# Patient Record
Sex: Female | Born: 1975
Health system: Southern US, Community
[De-identification: ages and names within clinical notes are randomized; demographics above are authoritative.]

## PROBLEM LIST (undated history)

## (undated) DIAGNOSIS — E039 Hypothyroidism, unspecified: Secondary | ICD-10-CM

## (undated) DIAGNOSIS — D649 Anemia, unspecified: Secondary | ICD-10-CM

## (undated) HISTORY — DX: Hypothyroidism, unspecified: E03.9

## (undated) HISTORY — PX: ABLATION: SHX5711

## (undated) HISTORY — PX: WISDOM TOOTH EXTRACTION: SHX21

## (undated) HISTORY — DX: Anemia, unspecified: D64.9

---

## 1998-11-13 ENCOUNTER — Emergency Department (HOSPITAL_COMMUNITY): Admission: EM | Admit: 1998-11-13 | Discharge: 1998-11-13 | Payer: Self-pay | Admitting: Emergency Medicine

## 1998-11-13 ENCOUNTER — Encounter: Payer: Self-pay | Admitting: Emergency Medicine

## 1999-04-21 ENCOUNTER — Emergency Department (HOSPITAL_COMMUNITY): Admission: EM | Admit: 1999-04-21 | Discharge: 1999-04-21 | Payer: Self-pay | Admitting: Emergency Medicine

## 1999-04-21 ENCOUNTER — Encounter: Payer: Self-pay | Admitting: Emergency Medicine

## 1999-11-08 ENCOUNTER — Emergency Department (HOSPITAL_COMMUNITY): Admission: EM | Admit: 1999-11-08 | Discharge: 1999-11-08 | Payer: Self-pay | Admitting: Emergency Medicine

## 2000-10-28 ENCOUNTER — Emergency Department (HOSPITAL_COMMUNITY): Admission: EM | Admit: 2000-10-28 | Discharge: 2000-10-28 | Payer: Self-pay | Admitting: Emergency Medicine

## 2001-04-15 ENCOUNTER — Emergency Department (HOSPITAL_COMMUNITY): Admission: EM | Admit: 2001-04-15 | Discharge: 2001-04-16 | Payer: Self-pay | Admitting: Emergency Medicine

## 2001-04-16 ENCOUNTER — Emergency Department (HOSPITAL_COMMUNITY): Admission: EM | Admit: 2001-04-16 | Discharge: 2001-04-16 | Payer: Self-pay | Admitting: Emergency Medicine

## 2001-06-16 ENCOUNTER — Emergency Department (HOSPITAL_COMMUNITY): Admission: EM | Admit: 2001-06-16 | Discharge: 2001-06-16 | Payer: Self-pay | Admitting: Emergency Medicine

## 2002-02-14 ENCOUNTER — Observation Stay (HOSPITAL_COMMUNITY): Admission: EM | Admit: 2002-02-14 | Discharge: 2002-02-15 | Payer: Self-pay

## 2002-02-15 ENCOUNTER — Encounter: Payer: Self-pay | Admitting: Internal Medicine

## 2005-11-14 ENCOUNTER — Other Ambulatory Visit: Admission: RE | Admit: 2005-11-14 | Discharge: 2005-11-14 | Payer: Self-pay | Admitting: Gynecology

## 2006-05-17 ENCOUNTER — Inpatient Hospital Stay (HOSPITAL_COMMUNITY): Admission: AD | Admit: 2006-05-17 | Discharge: 2006-05-20 | Payer: Self-pay | Admitting: Gynecology

## 2006-07-06 ENCOUNTER — Other Ambulatory Visit: Admission: RE | Admit: 2006-07-06 | Discharge: 2006-07-06 | Payer: Self-pay | Admitting: Gynecology

## 2008-11-15 ENCOUNTER — Emergency Department (HOSPITAL_COMMUNITY): Admission: EM | Admit: 2008-11-15 | Discharge: 2008-11-15 | Payer: Self-pay | Admitting: Emergency Medicine

## 2010-12-19 LAB — POCT I-STAT, CHEM 8
BUN: 6 mg/dL (ref 6–23)
Creatinine, Ser: 0.9 mg/dL (ref 0.4–1.2)
Glucose, Bld: 114 mg/dL — ABNORMAL HIGH (ref 70–99)
Hemoglobin: 14.6 g/dL (ref 12.0–15.0)
Sodium: 139 mEq/L (ref 135–145)
TCO2: 19 mmol/L (ref 0–100)

## 2011-01-24 NOTE — H&P (Signed)
**Note Andrea-Identified via Obfuscation** NAME:  Andrea Obrien, Andrea Obrien              ACCOUNT NO.:  [id number]   MEDICAL RECORD NO.:  [id number]          PATIENT TYPE:  MAT   LOCATION:  [facility]                          FACILITY:  [facility]   PHYSICIAN:  Juan H. Lily Peer, M.D.DATE OF BIRTH:  08-21-1976   DATE OF ADMISSION:  05/17/2006  DATE OF DISCHARGE:                                HISTORY & PHYSICAL   CHIEF COMPLAINT:  Contractions.   HISTORY:  The patient is a 35 year old, gravida 3, para 1, ab 1, at 37-5/[redacted]  weeks gestation complaining of contractions that started earlier this  morning.  She had been checked her in the emergency room.  She was 1-2 cm,  about 80% effaced, -3 station, ambulated for a couple hours, returned,  service the same.  The patient feeling more contractions which they are  palpating at every 3-4 minutes apart with a reassuring fetal heart rate  tracing.  Her GBS status is negative.  She declined first trimester  screening as well as cystic fibrosis screening and maternal serum alpha  fetoprotein.  Otherwise she has had an uneventful prenatal course with the  exception of iron-deficiency anemia.   PAST MEDICAL HISTORY:  1. Spontaneous ab 1974.  2. Normal spontaneous vaginal delivery in 2004.   REVIEW OF SYSTEMS:  See office form.   PHYSICAL EXAM:  VITAL SIGNS:  Stable.  Afebrile.  HEENT:  Unremarkable.  NECK:  Supple.  Trachea midline.  No carotid bruits.  No thyromegaly.  LUNGS:  Clear to auscultation without rhonchi or wheezes.  HEART:  Regular rate and rhythm.  No murmurs or gallops.  BREAST EXAM:  Not done.  ABDOMEN:  Gravid uterus.  Vertex presentation by Leapole maneuver.  PELVIC EXAM:  Cervix 1-2, 80% effaced, -3 station.  EXTREMITIES:  Deep tendon reflexes 1+. Negative clonus.   PRENATAL LABS:  A+ blood type.  Negative antibody screen.  VDRL  was  nonreactive.  Rubella immune.  Hepatitis B surface antigen and HIV were  negative.  Declined cystic fibrosis screening as well as first-trimester  screening and maternal alpha fetoprotein.  GBS culture was negative.   ASSESSMENT:  This is a 35 year old, gravida 3, para 1, ab 1, at 37-5/[redacted] weeks  gestation in early labor.  Reassuring fetal heart rate tracing.  Vital signs  stable.  Afebrile.  Group B strep culture was negative.  The patient will be  admitted and will allow to ambulate in Labor and Delivery until she gets  into active labor and then plan on placing an epidural with subsequent  artifical ruptured membrane.   PLAN:  As per assessment above.      Juan H. Lily Peer, M.D.  Electronically Signed     JHF/MEDQ  D:  05/17/2006  T:  05/17/2006  Job:  [id number]

## 2011-01-24 NOTE — Discharge Summary (Signed)
[facility]  Patient:    Andrea Obrien, Andrea Obrien Visit Number: [id number] MRN: [id number]          Service Type: MED Location: [phone] Attending Physician:  Selina Cooley Dictated by:   Selina Cooley, M.D. Admit Date:  02/13/2002 Disc. Date: 02/15/02                             Discharge Summary  DISCHARGE DIAGNOSES: 1. Drug overdose (Fiorinal). 2. Suicide attempt. 3. Bipolar disorder. 4. Depressive episode. 5. Hyperkalemia and mild metabolic acidosis secondary to aspirin overdose,    bicarbonate 17, aspirin level 21, down to 8 at the time of discharge. 6. Intrauterine pregnancy, viable fetus confirmed by ultrasound at    7 weeks 5 days. 7. Normocytic anemia consistent with pregnancy with hemoglobin 11.3 8. Prior psychiatric admission for drug overdose.  CONDITION UPON DISCHARGE:  Medically stable.  DISPOSITION:   Transferred to [facility] facility given ongoing suicide risk.  CONSULTATIONS:  Adelene Amas. Williford, M.D., psychiatry.  REASON FOR ADMISSION:  The patient is a 35 year old white female, [redacted] weeks pregnant, who was admitted for intentional drug overdose, reportedly swallowing 40 tablets of Fiorinal.  The patient reports current social stressor with fiance threatening to break up with her.  Of note, the patient has had prior admission to psychiatric facility for drug overdose.  DISCHARGE MEDICATIONS:  Prenatal vitamin 1 p.o. q.d.  HOSPITAL COURSE:  #1 - INTENTIONAL DRUG OVERDOSE WITH FIORINAL:  Components of butalbital, which is a barbiturate, and aspirin.  The patients urine toxicity screen was positive for barbituates, and her aspirin level was elevated at 21.  She did have mild metabolic acidosis with a bicarbonate of 17.  She was also noted to be hyperkalemic.  She was initially given IV fluids and observed on the medical floor including telemetry.  Ultimately she was felt to be medically stable, not  requiring IV fluids or telemetry monitoring.  She was initially somnolent, but at the time of discharge, she was completely alert, awake, and oriented.  #2 - INTRAUTERINE PREGNANCY:  The patient was reportedly [redacted] weeks pregnant, and this pregnancy was confirmed by ultrasound to be 7 weeks and 5 days.  The pregnancy appeared to be viable.  The patient will need to follow up with her gynecologist in [place] after discharge.  #3 - BIPOLAR DISORDER WITH DEPRESSIVE EPISODE:  The patient was seen by psychiatry, ultimately felt to be appropriate for inpatient psychiatric care given ongoing risk for suicide.  The patient will thus be transferred to [facility] this afternoon. Dictated by:   Selina Cooley, M.D. Attending Physician:  Selina Cooley DD:  02/15/02 TD:  02/15/02 Job: [id number]

## 2011-10-20 ENCOUNTER — Ambulatory Visit (INDEPENDENT_AMBULATORY_CARE_PROVIDER_SITE_OTHER): Payer: Managed Care, Other (non HMO) | Admitting: Family

## 2011-10-20 ENCOUNTER — Encounter: Payer: Self-pay | Admitting: Family

## 2011-10-20 VITALS — BP 112/80 | Ht 62.25 in | Wt 141.0 lb

## 2011-10-20 DIAGNOSIS — G43909 Migraine, unspecified, not intractable, without status migrainosus: Secondary | ICD-10-CM

## 2011-10-20 DIAGNOSIS — M62838 Other muscle spasm: Secondary | ICD-10-CM

## 2011-10-20 MED ORDER — AMITRIPTYLINE HCL 10 MG PO TABS: 10.0000 mg | ORAL_TABLET | Freq: Every day | ORAL | Status: DC

## 2011-10-20 MED ORDER — CYCLOBENZAPRINE HCL 10 MG PO TABS: 10.0000 mg | ORAL_TABLET | Freq: Three times a day (TID) | ORAL | Status: DC | PRN

## 2011-10-20 NOTE — Progress Notes (Signed)
  Subjective:    Patient ID: Andrea Obrien, female    DOB: Mar 10, 1976, 36 y.o.   MRN: [id number]  Migraine  This is a recurrent problem. The problem has been gradually worsening. The pain is located in the occipital and right unilateral region. The pain quality is similar to prior headaches. The quality of the pain is described as aching and throbbing. The pain is at a severity of 8/10. The pain is moderate. Associated symptoms include nausea, phonophobia, photophobia, a visual change and vomiting. The symptoms are aggravated by emotional stress. She has tried Excedrin for the symptoms. The treatment provided moderate relief. Her past medical history is significant for migraine headaches and migraines in the family.      Review of Systems  Constitutional: Negative.   Eyes: Positive for photophobia.  Respiratory: Negative.   Cardiovascular: Negative.   Gastrointestinal: Positive for nausea and vomiting.  Genitourinary: Negative.   Musculoskeletal: Negative.   Skin: Negative.   Neurological: Positive for headaches.  Hematological: Negative.   Psychiatric/Behavioral: Negative.    No past medical history on file.  History   Social History  . Marital Status: Married    Spouse Name: N/A    Number of Children: N/A  . Years of Education: N/A   Occupational History  . Not on file.   Social History Main Topics  . Smoking status: Never Smoker   . Smokeless tobacco: Not on file  . Alcohol Use: Yes  . Drug Use: No  . Sexually Active: Not on file   Other Topics Concern  . Not on file   Social History Narrative  . No narrative on file    No past surgical history on file.  Family History  Problem Relation Age of Onset  . Cancer Father     prostate  . Heart disease Maternal Grandmother   . Arthritis Paternal Grandmother   . Cancer Paternal Grandfather     prostate    No Known Allergies  No current outpatient prescriptions on file prior to visit.    BP 112/80  Ht 5'  2.25" (1.581 m)  Wt 141 lb (63.957 kg)  BMI 25.58 kg/m2chart    Objective:   Physical Exam  Constitutional: She appears well-developed and well-nourished.  Neck: Normal range of motion. Neck supple.  Cardiovascular: Normal rate, regular rhythm and normal heart sounds.   Pulmonary/Chest: Effort normal and breath sounds normal.  Musculoskeletal: Normal range of motion.  Skin: Skin is warm and dry.  Psychiatric: She has a normal mood and affect.          Assessment & Plan:  Assessment:   1. Headaches, migraine  2. Muscle spasms, shoulders  3. Acute stress reaction  Plan: Amitriptyline 10 mg one tablet by mouth each bedtime. Patient was provided samples to try but warned not to mix. Try Frova 2.5 mg, Sumavel , Zomig  nasal spray. Will bring patient back for recheck in 2 weeks. For her muscle tension in her shoulders r 10 mg 3 times a day when necessary muscle spasms.

## 2011-10-20 NOTE — Patient Instructions (Signed)

## 2011-11-03 ENCOUNTER — Ambulatory Visit (INDEPENDENT_AMBULATORY_CARE_PROVIDER_SITE_OTHER): Payer: Managed Care, Other (non HMO) | Admitting: Family

## 2011-11-03 ENCOUNTER — Encounter: Payer: Self-pay | Admitting: Family

## 2011-11-03 DIAGNOSIS — M62838 Other muscle spasm: Secondary | ICD-10-CM

## 2011-11-03 DIAGNOSIS — G43909 Migraine, unspecified, not intractable, without status migrainosus: Secondary | ICD-10-CM

## 2011-11-03 NOTE — Progress Notes (Signed)
  Subjective:    Patient ID: Andrea Obrien, female    DOB: 11/05/1975, 36 y.o.   MRN: [id number]  HPI 36 year old white female is in for recheck of migraine headaches. She was started on amitriptyline 10 mg a day. That has worked well. In the last 2 weeks she has had one migraine headache. She used Frova, rescue medication and that did not work well. However, her headaches have decreased in intensity and frequency. Therefore she is happy with the amitriptyline.  Patient did take a Flexeril 10 mg when necessary for neck and shoulder tension. Patient reports the medication works well. However, it makes her very sleepy.   Review of Systems  Constitutional: Negative.   HENT: Negative.   Eyes: Negative.   Respiratory: Negative.   Cardiovascular: Negative.   Genitourinary: Negative.   Musculoskeletal: Negative.   Skin: Negative.   Neurological: Negative.   Hematological: Negative.   Psychiatric/Behavioral: Negative.        No past medical history on file.  History   Social History  . Marital Status: Married    Spouse Name: N/A    Number of Children: N/A  . Years of Education: N/A   Occupational History  . Not on file.   Social History Main Topics  . Smoking status: Never Smoker   . Smokeless tobacco: Not on file  . Alcohol Use: Yes  . Drug Use: No  . Sexually Active: Not on file   Other Topics Concern  . Not on file   Social History Narrative  . No narrative on file    No past surgical history on file.  Family History  Problem Relation Age of Onset  . Cancer Father     prostate  . Heart disease Maternal Grandmother   . Arthritis Paternal Grandmother   . Cancer Paternal Grandfather     prostate    No Known Allergies  Current Outpatient Prescriptions on File Prior to Visit  Medication Sig Dispense Refill  . amitriptyline (ELAVIL) 10 MG tablet Take 1 tablet (10 mg total) by mouth at bedtime.  30 tablet  2    BP 120/80  Temp(Src) 98.2 F (36.8 C) (Oral)  Wt  140 lb (63.504 kg)chart  Objective:   Physical Exam  Constitutional: She is oriented to person, place, and time. She appears well-developed and well-nourished.  HENT:  Head: Normocephalic and atraumatic.  Right Ear: External ear normal.  Left Ear: External ear normal.  Neck: Normal range of motion. Neck supple.  Cardiovascular: Normal rate, regular rhythm and normal heart sounds.   Pulmonary/Chest: Effort normal and breath sounds normal.  Abdominal: Soft. Bowel sounds are normal.  Musculoskeletal: Normal range of motion.  Neurological: She is alert and oriented to person, place, and time.  Skin: Skin is warm and dry.  Psychiatric: She has a normal mood and affect.          Assessment & Plan:  Assessment: Migraine headaches, muscle spasms  Plan: Since patient's condition appeared to be stable at this point. She will decrease her Flexeril to 5 mg when necessary. Continue amitriptyline 10 mg. She will try other rescue medications and his thigh which works best for her. Will recheck patient in 6 months and sooner when necessary.

## 2012-01-30 ENCOUNTER — Other Ambulatory Visit: Payer: Self-pay | Admitting: Family

## 2012-02-03 NOTE — Telephone Encounter (Signed)
Last OV 11/03/11. Last filled 10/20/2011

## 2012-07-22 ENCOUNTER — Encounter: Payer: Self-pay | Admitting: Family

## 2012-07-22 ENCOUNTER — Other Ambulatory Visit: Payer: Self-pay | Admitting: Family

## 2012-07-22 ENCOUNTER — Ambulatory Visit (INDEPENDENT_AMBULATORY_CARE_PROVIDER_SITE_OTHER): Payer: Managed Care, Other (non HMO) | Admitting: Family

## 2012-07-22 VITALS — BP 118/62 | HR 87 | Temp 98.8°F | Wt 147.0 lb

## 2012-07-22 DIAGNOSIS — Z23 Encounter for immunization: Secondary | ICD-10-CM

## 2012-07-22 DIAGNOSIS — G5603 Carpal tunnel syndrome, bilateral upper limbs: Secondary | ICD-10-CM

## 2012-07-22 DIAGNOSIS — G5600 Carpal tunnel syndrome, unspecified upper limb: Secondary | ICD-10-CM

## 2012-07-22 MED ORDER — KETOROLAC TROMETHAMINE 60 MG/2ML IM SOLN
60.0000 mg | Freq: Once | INTRAMUSCULAR | Status: AC
  Administered 2012-07-22: 60 mg via INTRAMUSCULAR

## 2012-07-22 MED ORDER — MELOXICAM 15 MG PO TABS: 15.0000 mg | ORAL_TABLET | Freq: Every day | ORAL | Status: DC

## 2012-07-22 MED ORDER — PREDNISONE 20 MG PO TABS: ORAL_TABLET | ORAL | Status: AC

## 2012-07-22 NOTE — Progress Notes (Signed)
Subjective:    Patient ID: Andrea Obrien, female    DOB: 14-Nov-1975, 36 y.o.   MRN: [id number]  HPI 36 year old white female, nonsmoker is in with complaints of bilateral hand or wrist pain x2 months. She's had similar pain off and on for the past several years but has worsened. She rates the pain 8/10 at its worst. She's been taken Advil and Aleve that helps with the swelling but does not relieve her symptoms. His the pain often awakes her at night. 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 Describes the pain as sharp and tingly, worse with fine motor skills.   Review of Systems  Respiratory: Negative.   Cardiovascular: Negative.   Musculoskeletal: Positive for myalgias and arthralgias.       Pain in hands and wrists  Skin: Negative.   Neurological: Positive for weakness.       Tingling, sharp shooting pains radiating to her hands from her forearms.   Hematological: Negative.   Psychiatric/Behavioral: Negative.    No past medical history on file.  History   Social History  . Marital Status: Married    Spouse Name: N/A    Number of Children: N/A  . Years of Education: N/A   Occupational History  . Not on file.   Social History Main Topics  . Smoking status: Never Smoker   . Smokeless tobacco: Not on file  . Alcohol Use: Yes  . Drug Use: No  . Sexually Active: Not on file   Other Topics Concern  . Not on file   Social History Narrative  . No narrative on file    No past surgical history on file.  Family History  Problem Relation Age of Onset  . Cancer Father     prostate  . Heart disease Maternal Grandmother   . Arthritis Paternal Grandmother   . Cancer Paternal Grandfather     prostate    No Known Allergies  Current Outpatient Prescriptions on File Prior to Visit  Medication Sig Dispense Refill  . amitriptyline (ELAVIL) 10 MG tablet Take 1 tablet (10 mg total) by mouth at bedtime.  30 tablet  2  . cyclobenzaprine (FLEXERIL)  10 MG tablet Take 10 mg by mouth 2 (two) times daily as needed.       . cyclobenzaprine (FLEXERIL) 10 MG tablet TAKE ONE TABLET BY MOUTH THREE TIMES DAILY AS NEEDED FOR MUSCLE SPASM  30 tablet  3   No current facility-administered medications on file prior to visit.    BP 118/62  Pulse 87  Temp 98.8 F (37.1 C) (Oral)  Wt 147 lb (66.679 kg)  SpO2 98%chart    Objective:   Physical Exam  Constitutional: She is oriented to person, place, and time. She appears well-developed and well-nourished.  HENT:  Right Ear: External ear normal.  Left Ear: External ear normal.  Nose: Nose normal.  Mouth/Throat: Oropharynx is clear and moist.  Neck: Normal range of motion. Neck supple.  Cardiovascular: Normal rate and regular rhythm.   Pulmonary/Chest: Effort normal and breath sounds normal.  Musculoskeletal: She exhibits tenderness.       Positive Tinel sign. Positive Phalen's sign. Grip strength diminished, one out of two. Tenderness to palpation of the lateral epicondyle   Neurological: She is alert and oriented to person, place, and time. She has normal reflexes. She displays normal reflexes. No cranial nerve deficit. She exhibits normal muscle tone. Coordination normal.  Skin: Skin is  warm and dry.  Psychiatric: She has a normal mood and affect.    Toradol 60 mg IM x1      Assessment & Plan:  Assessment: Carpal tunnel syndrome  Plan: Prednisone 60x3, 40x3, 20x3. Maintain with Mobic 15 mg once daily with food. Night splints to the wrist bilaterally. Patient call the office if symptoms worsen or persist. Recheck a schedule, appearing.

## 2012-07-22 NOTE — Patient Instructions (Addendum)

## 2012-11-05 ENCOUNTER — Other Ambulatory Visit: Payer: Self-pay | Admitting: Family

## 2012-11-09 ENCOUNTER — Ambulatory Visit (INDEPENDENT_AMBULATORY_CARE_PROVIDER_SITE_OTHER): Payer: Managed Care, Other (non HMO) | Admitting: Family

## 2012-11-09 ENCOUNTER — Encounter: Payer: Self-pay | Admitting: Family

## 2012-11-09 VITALS — BP 100/80 | HR 76 | Temp 98.6°F | Wt 145.0 lb

## 2012-11-09 DIAGNOSIS — J329 Chronic sinusitis, unspecified: Secondary | ICD-10-CM

## 2012-11-09 MED ORDER — AMOXICILLIN 500 MG PO TABS: 1000.0000 mg | ORAL_TABLET | Freq: Two times a day (BID) | ORAL | Status: AC

## 2012-11-09 MED ORDER — FLUTICASONE PROPIONATE 50 MCG/ACT NA SUSP: 2.0000 | Freq: Every day | NASAL | Status: DC

## 2012-11-09 NOTE — Progress Notes (Signed)
  Subjective:    Patient ID: Andrea Obrien, female    DOB: 1975-10-23, 37 y.o.   MRN: [id number]  HPI 37 year old white female, nonsmoker is in today with sneezing, cough, congestion, sinus pressure and pain x2 weeks. She's been taken over-the-counter decongestants that originally about her symptoms but has not resolved. Denies any fever, muscle aches or pain.   Review of Systems  Constitutional: Negative.   HENT: Positive for congestion, rhinorrhea, postnasal drip and sinus pressure.   Eyes: Negative.   Respiratory: Positive for cough.   Cardiovascular: Negative.   Musculoskeletal: Negative.   Skin: Negative.   Allergic/Immunologic: Negative.   Psychiatric/Behavioral: Negative.    No past medical history on file.  History   Social History  . Marital Status: Married    Spouse Name: N/A    Number of Children: N/A  . Years of Education: N/A   Occupational History  . Not on file.   Social History Main Topics  . Smoking status: Never Smoker   . Smokeless tobacco: Not on file  . Alcohol Use: Yes  . Drug Use: No  . Sexually Active: Not on file   Other Topics Concern  . Not on file   Social History Narrative  . No narrative on file    No past surgical history on file.  Family History  Problem Relation Age of Onset  . Cancer Father     prostate  . Heart disease Maternal Grandmother   . Arthritis Paternal Grandmother   . Cancer Paternal Grandfather     prostate    No Known Allergies  Current Outpatient Prescriptions on File Prior to Visit  Medication Sig Dispense Refill  . amitriptyline (ELAVIL) 10 MG tablet TAKE  ONE TABLET BY MOUTH NIGHTLY AT BEDTIME  30 tablet  1  . cyclobenzaprine (FLEXERIL) 10 MG tablet Take 10 mg by mouth 2 (two) times daily as needed.       . cyclobenzaprine (FLEXERIL) 10 MG tablet TAKE ONE TABLET BY MOUTH THREE TIMES DAILY AS NEEDED FOR MUSCLE SPASM  30 tablet  3  . meloxicam (MOBIC) 15 MG tablet Take 1 tablet (15 mg total) by mouth daily.   30 tablet  5   No current facility-administered medications on file prior to visit.    BP 100/80  Pulse 76  Temp(Src) 98.6 F (37 C) (Oral)  Wt 145 lb (65.772 kg)  BMI 26.31 kg/m2  SpO2 97%chart     Objective:   Physical Exam  Constitutional: She is oriented to person, place, and time. She appears well-developed and well-nourished.  HENT:  Right Ear: External ear normal.  Left Ear: External ear normal.  Nose: Nose normal.  Mouth/Throat: Oropharynx is clear and moist.  Neck: Normal range of motion. Neck supple. No thyromegaly present.  Cardiovascular: Normal rate, regular rhythm and normal heart sounds.   Pulmonary/Chest: Effort normal and breath sounds normal.  Musculoskeletal: Normal range of motion.  Neurological: She is alert and oriented to person, place, and time.  Skin: Skin is warm and dry.  Psychiatric: She has a normal mood and affect.          Assessment & Plan:  Assessment:  1. Acute sinusitis  Plan: Amoxicillin 500 mg 2 capsules by mouth twice a day x10 days. Flonase 2 sprays in each nostril once a day. Patient to call the office if symptoms worsen or persist. Recheck a schedule, and as needed.

## 2012-11-09 NOTE — Patient Instructions (Addendum)

## 2014-07-10 ENCOUNTER — Encounter: Payer: Self-pay | Admitting: Family

## 2014-09-11 ENCOUNTER — Telehealth: Payer: Self-pay | Admitting: Family

## 2014-09-11 NOTE — Telephone Encounter (Signed)
Can pt be worked in for a physical the week of 10/02/14 or the week of  2/1//16

## 2014-09-12 NOTE — Telephone Encounter (Signed)
You can create a 30 min slot somewhere.

## 2014-09-12 NOTE — Telephone Encounter (Signed)
Pt has been sch

## 2014-09-14 ENCOUNTER — Other Ambulatory Visit (INDEPENDENT_AMBULATORY_CARE_PROVIDER_SITE_OTHER): Payer: Managed Care, Other (non HMO)

## 2014-09-14 DIAGNOSIS — Z0000 Encounter for general adult medical examination without abnormal findings: Secondary | ICD-10-CM

## 2014-09-14 LAB — POCT URINALYSIS DIPSTICK
BILIRUBIN UA: NEGATIVE
Glucose, UA: NEGATIVE
Ketones, UA: NEGATIVE
LEUKOCYTES UA: NEGATIVE
NITRITE UA: NEGATIVE
PROTEIN UA: NEGATIVE
Spec Grav, UA: 1.02
UROBILINOGEN UA: 0.2
pH, UA: 6.5

## 2014-09-14 LAB — CBC WITH DIFFERENTIAL/PLATELET
BASOS PCT: 1.4 % (ref 0.0–3.0)
Basophils Absolute: 0.1 10*3/uL (ref 0.0–0.1)
EOS ABS: 0.3 10*3/uL (ref 0.0–0.7)
Eosinophils Relative: 4.8 % (ref 0.0–5.0)
HEMATOCRIT: 34.3 % — AB (ref 36.0–46.0)
HEMOGLOBIN: 11 g/dL — AB (ref 12.0–15.0)
LYMPHS ABS: 2 10*3/uL (ref 0.7–4.0)
LYMPHS PCT: 37 % (ref 12.0–46.0)
MCHC: 32.1 g/dL (ref 30.0–36.0)
MCV: 78.6 fl (ref 78.0–100.0)
MONO ABS: 0.4 10*3/uL (ref 0.1–1.0)
MONOS PCT: 8.1 % (ref 3.0–12.0)
NEUTROS PCT: 48.7 % (ref 43.0–77.0)
Neutro Abs: 2.6 10*3/uL (ref 1.4–7.7)
PLATELETS: 368 10*3/uL (ref 150.0–400.0)
RBC: 4.36 Mil/uL (ref 3.87–5.11)
RDW: 15.6 % — AB (ref 11.5–15.5)
WBC: 5.4 10*3/uL (ref 4.0–10.5)

## 2014-09-14 LAB — HEPATIC FUNCTION PANEL
ALBUMIN: 4.5 g/dL (ref 3.5–5.2)
ALK PHOS: 26 U/L — AB (ref 39–117)
ALT: 13 U/L (ref 0–35)
AST: 17 U/L (ref 0–37)
BILIRUBIN TOTAL: 0.8 mg/dL (ref 0.2–1.2)
Bilirubin, Direct: 0.1 mg/dL (ref 0.0–0.3)
TOTAL PROTEIN: 7.3 g/dL (ref 6.0–8.3)

## 2014-09-14 LAB — BASIC METABOLIC PANEL
BUN: 12 mg/dL (ref 6–23)
CO2: 25 mEq/L (ref 19–32)
Calcium: 9.3 mg/dL (ref 8.4–10.5)
Chloride: 106 mEq/L (ref 96–112)
Creatinine, Ser: 0.7 mg/dL (ref 0.4–1.2)
GFR: 96 mL/min (ref >60.00)
Glucose, Bld: 94 mg/dL (ref 70–99)
POTASSIUM: 4.5 meq/L (ref 3.5–5.1)
Sodium: 139 mEq/L (ref 135–145)

## 2014-09-14 LAB — LIPID PANEL
Cholesterol: 165 mg/dL (ref 0–200)
HDL: 66.9 mg/dL (ref >39.00)
LDL CALC: 83 mg/dL (ref 0–99)
NONHDL: 98.1
Total CHOL/HDL Ratio: 2
Triglycerides: 75 mg/dL (ref 0.0–149.0)
VLDL: 15 mg/dL (ref 0.0–40.0)

## 2014-09-14 LAB — TSH: TSH: 2.74 u[IU]/mL (ref 0.35–4.50)

## 2014-09-26 ENCOUNTER — Encounter: Payer: Managed Care, Other (non HMO) | Admitting: Family

## 2014-09-27 ENCOUNTER — Encounter: Payer: Self-pay | Admitting: Family

## 2014-09-27 ENCOUNTER — Ambulatory Visit (INDEPENDENT_AMBULATORY_CARE_PROVIDER_SITE_OTHER): Payer: Managed Care, Other (non HMO) | Admitting: Family

## 2014-09-27 VITALS — BP 90/60 | HR 77 | Temp 98.6°F | Ht 62.5 in | Wt 113.9 lb

## 2014-09-27 DIAGNOSIS — Z0000 Encounter for general adult medical examination without abnormal findings: Secondary | ICD-10-CM

## 2014-09-27 DIAGNOSIS — F411 Generalized anxiety disorder: Secondary | ICD-10-CM

## 2014-09-27 MED ORDER — VENLAFAXINE HCL ER 37.5 MG PO CP24: 37.5000 mg | ORAL_CAPSULE | Freq: Every day | ORAL | Status: DC

## 2014-09-27 NOTE — Progress Notes (Signed)
Pre visit review using our clinic review tool, if applicable. No additional management support is needed unless otherwise documented below in the visit note. 

## 2014-09-27 NOTE — Patient Instructions (Signed)

## 2014-09-27 NOTE — Progress Notes (Signed)
Subjective:    Patient ID: Andrea Obrien, female    DOB: 02/03/1976, 39 y.o.   MRN: [id number]  HPI 39 year old white female, nonsmoker is in today for complete physical exam.  Fasting labs done at all normal except hemoglobin of 11. Has a history of anemia not currently taking any multivitamins or iron supplements. Does report having more anxiety recently. Has a family history significant for anxiety in her mother and father. Never been treated for anxiety in the past. Symptoms are characterized by increased heart rate, nausea and dizziness that occur suddenly. Typically it's around stressful times. Has had 2 episodes since Thanksgiving. He is reporting that she is more snappy and irritable. Denies any feelings of helplessness, hopelessness no thoughts of death or dying. She is exercising, running. Has ran several half marathons over the last 6 months.   Review of Systems  Constitutional: Negative.   HENT: Negative.   Eyes: Negative.   Respiratory: Negative.   Cardiovascular: Negative.   Gastrointestinal: Negative.   Endocrine: Negative.   Genitourinary: Negative.   Musculoskeletal: Negative.   Skin: Negative.   Allergic/Immunologic: Negative.   Neurological: Negative.   Hematological: Negative.   Psychiatric/Behavioral: Negative for sleep disturbance, decreased concentration and agitation. The patient is nervous/anxious.    History reviewed. No pertinent past medical history.  History   Social History  . Marital Status: Married    Spouse Name: N/A    Number of Children: N/A  . Years of Education: N/A   Occupational History  . Not on file.   Social History Main Topics  . Smoking status: Never Smoker   . Smokeless tobacco: Not on file  . Alcohol Use: Yes  . Drug Use: No  . Sexual Activity: Not on file   Other Topics Concern  . Not on file   Social History Narrative    History reviewed. No pertinent past surgical history.  Family History  Problem Relation Age of  Onset  . Cancer Father     prostate  . Heart disease Maternal Grandmother   . Arthritis Paternal Grandmother   . Cancer Paternal Grandfather     prostate    No Known Allergies  No current outpatient prescriptions on file prior to visit.   No current facility-administered medications on file prior to visit.    BP 90/60 mmHg  Pulse 77  Temp(Src) 98.6 F (37 C) (Oral)  Ht 5' 2.5" (1.588 m)  Wt 113 lb 14.4 oz (51.665 kg)  BMI 20.49 kg/m2chart     Objective:   Physical Exam  Constitutional: She is oriented to person, place, and time. She appears well-developed and well-nourished.  HENT:  Head: Normocephalic and atraumatic.  Right Ear: External ear normal.  Left Ear: External ear normal.  Nose: Nose normal.  Mouth/Throat: Oropharynx is clear and moist.  Eyes: Conjunctivae and EOM are normal. Pupils are equal, round, and reactive to light.  Neck: Normal range of motion. Neck supple. No thyromegaly present.  Cardiovascular: Normal rate, regular rhythm and normal heart sounds.   Pulmonary/Chest: Effort normal and breath sounds normal.  Abdominal: Soft. Bowel sounds are normal.  Musculoskeletal: Normal range of motion.  Neurological: She is alert and oriented to person, place, and time. She displays normal reflexes. No cranial nerve deficit. Coordination normal.  Skin: Skin is warm and dry.  Psychiatric: She has a normal mood and affect.          Assessment & Plan:  Andrea Obrien was seen today for annual exam.  Diagnoses and associated orders for this visit:  Preventative health care  Generalized anxiety disorder  Other Orders - venlafaxine XR (EFFEXOR XR) 37.5 MG 24 hr capsule; Take 1 capsule (37.5 mg total) by mouth daily with breakfast.    continue exercising. Start Effexor 37 have milligrams once a day. Recheck in 3 weeks and sooner as needed.

## 2014-10-16 ENCOUNTER — Encounter: Payer: Self-pay | Admitting: Family

## 2014-10-17 ENCOUNTER — Encounter: Payer: Self-pay | Admitting: Family Medicine

## 2014-10-17 ENCOUNTER — Ambulatory Visit (INDEPENDENT_AMBULATORY_CARE_PROVIDER_SITE_OTHER): Payer: Managed Care, Other (non HMO) | Admitting: Family Medicine

## 2014-10-17 VITALS — BP 100/50 | HR 68 | Temp 97.8°F | Ht 62.75 in | Wt 115.6 lb

## 2014-10-17 DIAGNOSIS — F411 Generalized anxiety disorder: Secondary | ICD-10-CM

## 2014-10-17 MED ORDER — LORAZEPAM 0.5 MG PO TABS: 0.5000 mg | ORAL_TABLET | Freq: Three times a day (TID) | ORAL | Status: DC | PRN

## 2014-10-17 NOTE — Progress Notes (Signed)
Pre visit review using our clinic review tool, if applicable. No additional management support is needed unless otherwise documented below in the visit note. 

## 2014-10-17 NOTE — Patient Instructions (Signed)
Use the ativan rarely for panic attacks only  Stop the Effexor  Follow up new patient visit in 2-3 weeks  Set up cognitive behavioral therapy with number provided

## 2014-10-17 NOTE — Progress Notes (Signed)
HPI:  GAD: -worries all the time about everything -has had a few panic attacks - works full time, alone a lot with 2 kids as husband travels a lot -started on effexor about 3 weeks ago and is not tolerating at all - seems to be causing insomnia, on edge, worsened anxiety and wants to stop -mother does not tolerate medications well, and pt is nervous about medications  -neighbor is a doctor and was not happy with choice of effexor  Symptoms of GAD - prior to medication: Restlessness, on edge:  Yes - frequent Easily Fatigued: a little -depression: no Difficulty concentrating: no Irritability: yes Muscle tension: yes Sleep Disturbance: not much prior to medication Interest deficit/anhedonia: no Guilt (worthlessness, hopelessness, regret): no Appetite disorder: no Psychomotor retardation or agitation: no Suicidality: no Distractibility and easy frustration: no Irresponsibility/erratic/uninhibited behavior:no Grandiosity:no Flight of Ideas: no Activity - increased with weight loss and increased libido:  Sleep decreased:no Talkative: no No panic attacks in 3-4 weeks but is nervous about having a panic attack  ROS: See pertinent positives and negatives per HPI.  No past medical history on file.  No past surgical history on file.  Family History  Problem Relation Age of Onset  . Cancer Father     prostate  . Heart disease Maternal Grandmother   . Arthritis Paternal Grandmother   . Cancer Paternal Grandfather     prostate    History   Social History  . Marital Status: Married    Spouse Name: N/A    Number of Children: N/A  . Years of Education: N/A   Social History Main Topics  . Smoking status: Never Smoker   . Smokeless tobacco: None  . Alcohol Use: Yes  . Drug Use: No  . Sexual Activity: None   Other Topics Concern  . None   Social History Narrative     Current outpatient prescriptions:  .  venlafaxine XR (EFFEXOR XR) 37.5 MG 24 hr capsule, Take 1  capsule (37.5 mg total) by mouth daily with breakfast., Disp: 30 capsule, Rfl: 3 .  LORazepam (ATIVAN) 0.5 MG tablet, Take 1 tablet (0.5 mg total) by mouth every 8 (eight) hours as needed (for panic attack)., Disp: 10 tablet, Rfl: 0  EXAM:  Filed Vitals:   10/17/14 1608  BP: 100/50  Pulse: 68  Temp: 97.8 F (36.6 C)    Body mass index is 20.64 kg/(m^2).  GENERAL: vitals reviewed and listed above, alert, oriented, appears well hydrated and in no acute distress  HEENT: atraumatic, conjunttiva clear, no obvious abnormalities on inspection of external nose and ears  NECK: no obvious masses on inspection  LUNGS: clear to auscultation bilaterally, no wheezes, rales or rhonchi, good air movement  CV: HRRR, no peripheral edema  MS: moves all extremities without noticeable abnormality  PSYCH: pleasant and cooperative, mildly anxious, no obvious depression  ASSESSMENT AND PLAN:  Discussed the following assessment and plan:  Generalized anxiety disorder  -opted to stop the effexor - on very low dose  -she has opted not to start another daily medication right  -after discussion risks and benefits she opted to try bonzo for rare panic attacks only, not for regular use as she is concerned with side effects -we will follow up closely in about 2-3 weeks - wants to establish care with me so advised longer visit -advised CBT and she is seems like she plans to do this -Patient advised to return or notify a doctor immediately if symptoms worsen or persist  or new concerns arise.  Patient Instructions  Use the ativan rarely for panic attacks only  Stop the Effexor  Follow up new patient visit in 2-3 weeks  Set up cognitive behavioral therapy with number provided     Kriste Basque R.

## 2014-11-07 ENCOUNTER — Ambulatory Visit (INDEPENDENT_AMBULATORY_CARE_PROVIDER_SITE_OTHER): Payer: Managed Care, Other (non HMO) | Admitting: Family Medicine

## 2014-11-07 ENCOUNTER — Encounter: Payer: Self-pay | Admitting: Family Medicine

## 2014-11-07 VITALS — BP 110/72 | HR 86 | Temp 97.5°F | Ht 62.75 in | Wt 111.5 lb

## 2014-11-07 DIAGNOSIS — D649 Anemia, unspecified: Secondary | ICD-10-CM | POA: Insufficient documentation

## 2014-11-07 DIAGNOSIS — Z7689 Persons encountering health services in other specified circumstances: Secondary | ICD-10-CM

## 2014-11-07 DIAGNOSIS — F411 Generalized anxiety disorder: Secondary | ICD-10-CM

## 2014-11-07 DIAGNOSIS — Z7189 Other specified counseling: Secondary | ICD-10-CM

## 2014-11-07 NOTE — Progress Notes (Signed)
Pre visit review using our clinic review tool, if applicable. No additional management support is needed unless otherwise documented below in the visit note. 

## 2014-11-07 NOTE — Patient Instructions (Signed)
BEFORE YOU LEAVE: -follow up with me in 3 months  Schedule counseling
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
## 2014-11-10 ENCOUNTER — Ambulatory Visit (INDEPENDENT_AMBULATORY_CARE_PROVIDER_SITE_OTHER): Payer: Managed Care, Other (non HMO) | Admitting: Psychology

## 2014-11-10 DIAGNOSIS — F4322 Adjustment disorder with anxiety: Secondary | ICD-10-CM | POA: Diagnosis not present

## 2014-11-24 ENCOUNTER — Ambulatory Visit (INDEPENDENT_AMBULATORY_CARE_PROVIDER_SITE_OTHER): Payer: Managed Care, Other (non HMO) | Admitting: Psychology

## 2014-11-24 DIAGNOSIS — F4322 Adjustment disorder with anxiety: Secondary | ICD-10-CM

## 2014-11-29 ENCOUNTER — Ambulatory Visit (INDEPENDENT_AMBULATORY_CARE_PROVIDER_SITE_OTHER): Payer: Managed Care, Other (non HMO) | Admitting: Psychology

## 2014-11-29 DIAGNOSIS — F4322 Adjustment disorder with anxiety: Secondary | ICD-10-CM | POA: Diagnosis not present

## 2014-12-07 ENCOUNTER — Ambulatory Visit (INDEPENDENT_AMBULATORY_CARE_PROVIDER_SITE_OTHER): Payer: Managed Care, Other (non HMO) | Admitting: Psychology

## 2014-12-07 DIAGNOSIS — F4322 Adjustment disorder with anxiety: Secondary | ICD-10-CM

## 2014-12-13 ENCOUNTER — Ambulatory Visit (INDEPENDENT_AMBULATORY_CARE_PROVIDER_SITE_OTHER): Payer: Managed Care, Other (non HMO) | Admitting: Psychology

## 2014-12-13 DIAGNOSIS — F4322 Adjustment disorder with anxiety: Secondary | ICD-10-CM | POA: Diagnosis not present

## 2014-12-20 ENCOUNTER — Ambulatory Visit: Payer: Managed Care, Other (non HMO) | Admitting: Psychology

## 2015-01-03 ENCOUNTER — Ambulatory Visit (INDEPENDENT_AMBULATORY_CARE_PROVIDER_SITE_OTHER): Payer: Managed Care, Other (non HMO) | Admitting: Psychology

## 2015-01-03 DIAGNOSIS — F4322 Adjustment disorder with anxiety: Secondary | ICD-10-CM

## 2015-01-10 ENCOUNTER — Ambulatory Visit: Payer: Managed Care, Other (non HMO) | Admitting: Psychology

## 2015-01-17 ENCOUNTER — Ambulatory Visit: Payer: Managed Care, Other (non HMO) | Admitting: Psychology

## 2015-01-24 ENCOUNTER — Ambulatory Visit (INDEPENDENT_AMBULATORY_CARE_PROVIDER_SITE_OTHER): Payer: Managed Care, Other (non HMO) | Admitting: Psychology

## 2015-01-24 DIAGNOSIS — F4322 Adjustment disorder with anxiety: Secondary | ICD-10-CM

## 2015-01-31 ENCOUNTER — Ambulatory Visit (INDEPENDENT_AMBULATORY_CARE_PROVIDER_SITE_OTHER): Payer: Managed Care, Other (non HMO) | Admitting: Psychology

## 2015-01-31 DIAGNOSIS — F4322 Adjustment disorder with anxiety: Secondary | ICD-10-CM | POA: Diagnosis not present

## 2015-02-07 ENCOUNTER — Encounter: Payer: Self-pay | Admitting: Family Medicine

## 2015-02-07 ENCOUNTER — Ambulatory Visit (INDEPENDENT_AMBULATORY_CARE_PROVIDER_SITE_OTHER): Payer: Managed Care, Other (non HMO) | Admitting: Family Medicine

## 2015-02-07 ENCOUNTER — Ambulatory Visit (INDEPENDENT_AMBULATORY_CARE_PROVIDER_SITE_OTHER): Payer: Managed Care, Other (non HMO) | Admitting: Psychology

## 2015-02-07 VITALS — BP 100/58 | HR 64 | Temp 98.2°F | Ht 62.75 in | Wt 112.9 lb

## 2015-02-07 DIAGNOSIS — F411 Generalized anxiety disorder: Secondary | ICD-10-CM | POA: Diagnosis not present

## 2015-02-07 DIAGNOSIS — F4322 Adjustment disorder with anxiety: Secondary | ICD-10-CM | POA: Diagnosis not present

## 2015-02-07 NOTE — Progress Notes (Signed)
Pre visit review using our clinic review tool, if applicable. No additional management support is needed unless otherwise documented below in the visit note. 

## 2015-02-07 NOTE — Progress Notes (Signed)
  HPI:  GAD: -rare panic attacks, takes ativan rarely for this - has used a few pills -is doing better with CBT -did not tol effexor -husband is and alcoholic, she is safe, moved out with 2 kids 10/2014 - living with family - great support with parents -husband is getting help -she is feeling very good about this decision -she is seeing Dr. Jason FilaBray -denies: SI, worsening, depression  ROS: See pertinent positives and negatives per HPI.  Past Medical History  Diagnosis Date  . Anemia     reports since she was a teenager, iron def per her report, reports had work up and told to take iron    Past Surgical History  Procedure Laterality Date  . Wisdom tooth extraction      Family History  Problem Relation Age of Onset  . Cancer Father     prostate  . Heart disease Maternal Grandmother   . Hypertension Maternal Grandmother   . Arthritis Paternal Grandmother   . Cancer Paternal Grandfather     prostate    History   Social History  . Marital Status: Married    Spouse Name: N/A  . Number of Children: N/A  . Years of Education: N/A   Social History Main Topics  . Smoking status: Never Smoker   . Smokeless tobacco: Not on file  . Alcohol Use: Yes  . Drug Use: No  . Sexual Activity: Not on file   Other Topics Concern  . 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  FERROUS SULFATE PO, Take by mouth daily., Disp: , Rfl:  .  LORazepam (ATIVAN) 0.5 MG tablet, Take 1 tablet (0.5 mg total) by mouth every 8 (eight) hours as needed (for panic attack)., Disp: 10 tablet, Rfl: 0 .  Multiple Vitamin (MULTIVITAMIN) capsule, Take 1 capsule by  mouth daily., Disp: , Rfl:   EXAM:  Filed Vitals:   02/07/15 1107  BP: 100/58  Pulse: 64  Temp: 98.2 F (36.8 C)    Body mass index is 20.16 kg/(m^2).  GENERAL: vitals reviewed and listed above, alert, oriented, appears well hydrated and in no acute distress  HEENT: atraumatic, conjunttiva clear, no obvious abnormalities on inspection of external nose and ears  NECK: no obvious masses on inspection  LUNGS: clear to auscultation bilaterally, no wheezes, rales or rhonchi, good air movement  CV: HRRR, no peripheral edema  MS: moves all extremities without noticeable abnormality  PSYCH: pleasant and cooperative, no obvious depression or anxiety  ASSESSMENT AND PLAN:  Discussed the following assessment and plan:  Generalized anxiety disorder  -continue CBT and rare use of prn ativan -follow up in 3-4 months and as needed -Patient advised to return or notify a doctor immediately if symptoms worsen or persist or new concerns arise.  There are no Patient Instructions on file for this visit.   Kriste BasqueKIM, Donnie Gedeon R.

## 2015-02-14 ENCOUNTER — Ambulatory Visit: Payer: Managed Care, Other (non HMO) | Admitting: Psychology

## 2015-02-21 ENCOUNTER — Ambulatory Visit (INDEPENDENT_AMBULATORY_CARE_PROVIDER_SITE_OTHER): Payer: Managed Care, Other (non HMO) | Admitting: Psychology

## 2015-02-21 DIAGNOSIS — F4322 Adjustment disorder with anxiety: Secondary | ICD-10-CM | POA: Diagnosis not present

## 2015-02-27 ENCOUNTER — Ambulatory Visit (INDEPENDENT_AMBULATORY_CARE_PROVIDER_SITE_OTHER): Payer: Managed Care, Other (non HMO) | Admitting: Family Medicine

## 2015-02-27 ENCOUNTER — Telehealth: Payer: Self-pay | Admitting: Family Medicine

## 2015-02-27 ENCOUNTER — Encounter: Payer: Self-pay | Admitting: Family Medicine

## 2015-02-27 VITALS — BP 105/58 | HR 65 | Temp 98.3°F | Ht 62.75 in | Wt 114.0 lb

## 2015-02-27 DIAGNOSIS — R531 Weakness: Secondary | ICD-10-CM | POA: Diagnosis not present

## 2015-02-27 MED ORDER — ASPIRIN EC 81 MG PO TBEC: 81.0000 mg | DELAYED_RELEASE_TABLET | Freq: Every day | ORAL | Status: DC

## 2015-02-27 NOTE — Progress Notes (Signed)
Pre visit review using our clinic review tool, if applicable. No additional management support is needed unless otherwise documented below in the visit note. 

## 2015-02-27 NOTE — Progress Notes (Signed)
   Subjective:    Patient ID: Andrea Obrien, female    DOB: August 11, 1976, 39 y.o.   MRN: [id number]  HPI Here for the sudden onset of weakness and numbness in the left hand 4 days ago. She had been working out in the yard and she had come inside for a snack. As she picked up a food container she dropped it due to weakness in the left hand. She then tried to open it but could not. Then a few minutes later she stumbled down some stairs because she had sudden weakness and numbness in the left foot. She then sat down and rested. Most of these sx resolved over the next hour, but her strength has not returned to normal. She still feels slight numbness and weakness. She also developed a mild dull headache in the back of the head several days ago, and this persists. No nausea or vision or speech changes. She has a long hx of migraines, but she has never felt these sx before. She notes that her maternal aunt was diagnosed with MS at the age of 69.    Review of Systems  Respiratory: Negative.   Cardiovascular: Negative.   Neurological: Positive for weakness, numbness and headaches. Negative for dizziness, tremors, seizures, syncope, facial asymmetry, speech difficulty and light-headedness.       Objective:   Physical Exam  Constitutional: She is oriented to person, place, and time. She appears well-developed and well-nourished. No distress.  Gait is normal   Neck: No thyromegaly present.  Cardiovascular: Normal rate, regular rhythm, normal heart sounds and intact distal pulses.   No carotid bruits   Pulmonary/Chest: Effort normal and breath sounds normal.  Lymphadenopathy:    She has no cervical adenopathy.  Neurological: She is alert and oriented to person, place, and time. She has normal reflexes. No cranial nerve deficit. Coordination normal.  She has mild weakness in the left arm and hand, with reduced grip strength. She shows pronator drift in the left arm as well           Assessment & Plan:    Sudden onset of left sided weakness which could be the result of a stroke, or more likely a demyelinating illness like MS. We will get labs today and set up a brain MRI soon. Refer to Neurology. I asked her to start taking 81 mg of aspirin daily.

## 2015-02-27 NOTE — Telephone Encounter (Signed)
° °  Patient Name: Andrea Obrien DOB: 10-19-1975 Initial Comment Caller states on Saturday, she was trying to open a jar of Nutella, she couldn't open it. She had weird sensation on left side, felt off. Shaking. She was told by a co-worker could of been a TMI. Her left side feels tired. Numbness on left side. She never felt a step and fell down. Nurse Assessment Nurse: Charna Elizabeth, RN, Cathy Date/Time (Eastern Time): 02/27/2015 12:42:53 PM Confirm and document reason for call. If symptomatic, describe symptoms. ---Caller states she had numbness in her left hand after trying to open a jar 3 days ago. The numbness lasted for about an hour. No numbnes at this time. Has the patient traveled out of the country within the last 30 days? ---No Does the patient require triage? ---Yes Related visit to physician within the last 2 weeks? ---Yes Does the PT have any chronic conditions? (i.e. diabetes, asthma, etc.) ---Yes List chronic conditions. ---Anxiety, Stress Did the patient indicate they were pregnant? ---No Guidelines Guideline Title Affirmed Question Affirmed Notes Neurologic Deficit [1] Numbness (i.e., loss of sensation) of the face, arm or leg on one side of the body AND [2] sudden onset AND [3] transient (i.e., completely resolved) Final Disposition User Go to ED Now (or PCP triage) Charna Elizabeth, RN, Cathy Comments Scheduled for 3pm appointment with Dr. Clent Ridges today.

## 2015-02-28 ENCOUNTER — Ambulatory Visit (INDEPENDENT_AMBULATORY_CARE_PROVIDER_SITE_OTHER): Payer: Managed Care, Other (non HMO) | Admitting: Psychology

## 2015-02-28 DIAGNOSIS — F4322 Adjustment disorder with anxiety: Secondary | ICD-10-CM | POA: Diagnosis not present

## 2015-02-28 LAB — CBC WITH DIFFERENTIAL/PLATELET
BASOS ABS: 0.1 10*3/uL (ref 0.0–0.1)
BASOS PCT: 1 % (ref 0.0–3.0)
EOS ABS: 0.3 10*3/uL (ref 0.0–0.7)
Eosinophils Relative: 3.1 % (ref 0.0–5.0)
HCT: 31.9 % — ABNORMAL LOW (ref 36.0–46.0)
HEMOGLOBIN: 10.2 g/dL — AB (ref 12.0–15.0)
LYMPHS PCT: 34.7 % (ref 12.0–46.0)
Lymphs Abs: 3.2 10*3/uL (ref 0.7–4.0)
MCHC: 32.1 g/dL (ref 30.0–36.0)
MCV: 77.2 fl — ABNORMAL LOW (ref 78.0–100.0)
MONO ABS: 0.4 10*3/uL (ref 0.1–1.0)
Monocytes Relative: 4.1 % (ref 3.0–12.0)
NEUTROS PCT: 57.1 % (ref 43.0–77.0)
Neutro Abs: 5.3 10*3/uL (ref 1.4–7.7)
PLATELETS: 342 10*3/uL (ref 150.0–400.0)
RBC: 4.13 Mil/uL (ref 3.87–5.11)
RDW: 15.8 % — AB (ref 11.5–15.5)
WBC: 9.3 10*3/uL (ref 4.0–10.5)

## 2015-02-28 LAB — BASIC METABOLIC PANEL
BUN: 14 mg/dL (ref 6–23)
CALCIUM: 9.2 mg/dL (ref 8.4–10.5)
CO2: 23 meq/L (ref 19–32)
CREATININE: 0.72 mg/dL (ref 0.40–1.20)
Chloride: 105 mEq/L (ref 96–112)
GFR: 95.77 mL/min (ref >60.00)
Glucose, Bld: 79 mg/dL (ref 70–99)
Potassium: 3.7 mEq/L (ref 3.5–5.1)
SODIUM: 136 meq/L (ref 135–145)

## 2015-02-28 LAB — TSH: TSH: 2.91 u[IU]/mL (ref 0.35–4.50)

## 2015-02-28 LAB — VITAMIN B12: Vitamin B-12: 197 pg/mL — ABNORMAL LOW (ref 211–911)

## 2015-03-01 ENCOUNTER — Ambulatory Visit (INDEPENDENT_AMBULATORY_CARE_PROVIDER_SITE_OTHER): Payer: Managed Care, Other (non HMO) | Admitting: Neurology

## 2015-03-01 ENCOUNTER — Encounter: Payer: Self-pay | Admitting: Family Medicine

## 2015-03-01 ENCOUNTER — Encounter: Payer: Self-pay | Admitting: Neurology

## 2015-03-01 VITALS — BP 110/80 | HR 68 | Ht 62.75 in | Wt 111.6 lb

## 2015-03-01 DIAGNOSIS — R278 Other lack of coordination: Secondary | ICD-10-CM | POA: Diagnosis not present

## 2015-03-01 DIAGNOSIS — M6289 Other specified disorders of muscle: Secondary | ICD-10-CM | POA: Diagnosis not present

## 2015-03-01 DIAGNOSIS — R531 Weakness: Secondary | ICD-10-CM

## 2015-03-01 DIAGNOSIS — R29818 Other symptoms and signs involving the nervous system: Secondary | ICD-10-CM

## 2015-03-01 NOTE — Patient Instructions (Signed)
1.  Please contact primary care doctors office to set up B12 injections 2.  Call my office after you have had the MRI, so I can review your results 3.  Return to clinic in 3-4 weeks to determine the next step

## 2015-03-01 NOTE — Progress Notes (Signed)
[facility] Clinic Note - Initial Visit   Date: 03/01/2015   Andrea Obrien MRN: [id number] DOB: 05-Jan-1976   Dear Dr. Selena Batten:  Thank you for your kind referral of Andrea Obrien for consultation of left sided weakness. Although her history is well known to you, please allow [name] to reiterate it for the purpose of our medical record. The patient was accompanied to the clinic by self.    History of Present Illness: Andrea Obrien is a 39 y.o. right-handed Caucasian female with panic attacks and anxiety/depression (Dr. Forbes Cellar)  presenting for urgent evaluation of left sided weakness.   She was in the yard on 6/18 and she felt lightheaded.  She opened a jar of nutella and she developed left hand shaking and weakness, followed by a headache.  She was going upstairs to get ibuprofen and as she coming down the landing, she started loosing sensation of the left lower leg and ended up missing the step and fell, but was able to grab the banister.  The numbness of her leg lasted 15 minutes.   She felt a tingling sensation in the left hand over the thumb, index, and middle finger with associated left sided neck pain, again lasting 15-20 minutes.  She felt as her mental processing was slowed.  The following day, she felt as if her muscles were tired but did not have any pain.  Along with these symptoms, she had associated blurred vision and double vision, lasting about 30-minutes.    Her vitamin B12 level was found to be low at 197.  She denies vegan or vegetarian diet.  She is very active, marathon runner, attends zumba classes, etc.  Out-side paper records, electronic medical record, and images have been reviewed where available and summarized as:  Lab Results  Component Value Date   TSH 2.91 02/27/2015   Lab Results  Component Value Date   VITAMINB12 197* 02/27/2015    Past Medical History  Diagnosis Date  . Anemia     reports since she was a teenager, iron def  per her report, reports had work up and told to take iron    Past Surgical History  Procedure Laterality Date  . Wisdom tooth extraction       Medications:  Current Outpatient Prescriptions on File Prior to Visit  Medication Sig Dispense Refill  . aspirin EC 81 MG tablet Take 1 tablet (81 mg total) by mouth daily. 1 tablet 0  . FERROUS SULFATE PO Take by mouth daily.    Marland Kitchen LORazepam (ATIVAN) 0.5 MG tablet Take 1 tablet (0.5 mg total) by mouth every 8 (eight) hours as needed (for panic attack). 10 tablet 0  . Multiple Vitamin (MULTIVITAMIN) capsule Take 1 capsule by mouth daily.     No current facility-administered medications on file prior to visit.    Allergies: No Known Allergies  Family History: Family History  Problem Relation Age of Onset  . Prostate cancer Father     Living  . Heart disease Maternal Grandmother   . Hypertension Maternal Grandmother   . Arthritis Paternal Grandmother   . Cancer Paternal Grandfather     prostate  . Multiple sclerosis Maternal Aunt   . Fibromyalgia Mother     Living  . Healthy Brother   . Healthy Son     x 2     Social History: History   Social History  . Marital Status: Married    Spouse Name: N/A  . Number of Children:  N/A  . Years of Education: N/A   Occupational History  . Not on file.   Social History Main Topics  . Smoking status: Never Smoker   . Smokeless tobacco: Never Used  . Alcohol Use: No  . Drug Use: No  . Sexual Activity: Not on file   Other Topics Concern  . Not on file   Social History Narrative   Work or School: Corporate treasurer, works full time      [employer] with husband and 2 kids (8 and 56 yo boys) in a 2 story home - husband recovering alcoholic      Spiritual Beliefs: Catholic - good support      Lifestyle: regular exercise - runs marathons, zumba and yoga; diet is good          Review of Systems:  CONSTITUTIONAL: No fevers, chills, night sweats, or weight  loss.   EYES: No visual changes or eye pain ENT: No hearing changes.  No history of nose bleeds.   RESPIRATORY: No cough, wheezing and shortness of breath.   CARDIOVASCULAR: Negative for chest pain, and palpitations.   GI: Negative for abdominal discomfort, blood in stools or black stools.  No recent change in bowel habits.   GU:  No history of incontinence.   MUSCLOSKELETAL: No history of joint pain or swelling.  No myalgias.   SKIN: Negative for lesions, rash, and itching.   HEMATOLOGY/ONCOLOGY: Negative for prolonged bleeding, bruising easily, and swollen nodes.  No history of cancer.   ENDOCRINE: Negative for cold or heat intolerance, polydipsia or goiter.   PSYCH:  +depression ++anxiety symptoms.   NEURO: As Above.   Vital Signs:  BP 110/80 mmHg  Pulse 68  Ht 5' 2.75" (1.594 m)  Wt 111 lb 9 oz (50.604 kg)  BMI 19.92 kg/m2  SpO2 99%  LMP 02/13/2015   General Medical Exam:   General:  Well appearing, comfortable.   Eyes/ENT: see cranial nerve examination.   Neck: No masses appreciated.  Full range of motion without tenderness.  No carotid bruits. Respiratory:  Clear to auscultation, good air entry bilaterally.   Cardiac:  Regular rate and rhythm, no murmur.   Extremities:  No deformities, edema, or skin discoloration.  Skin:  No rashes or lesions.  Neurological Exam: MENTAL STATUS including orientation to time, place, person, recent and remote memory, attention span and concentration, language, and fund of knowledge is normal.  Speech is not dysarthric.  CRANIAL NERVES: II:  No visual field defects.  Unremarkable fundi.   III-IV-VI: Pupils equal round and reactive to light.  Normal conjugate, extra-ocular eye movements in all directions of gaze.  No nystagmus.  No ptosis.   V:  Normal facial sensation.  Jaw jerk is absent.   VII:  Normal facial symmetry and movements.  No pathologic facial reflexes.  VIII:  Normal hearing and vestibular function.   IX-X:  Normal palatal  movement.   XI:  Normal shoulder shrug and head rotation.   XII:  Normal tongue strength and range of motion, no deviation or fasciculation.  MOTOR:  No atrophy, fasciculations or abnormal movements.  No pronator drift.    Right Upper Extremity:    Left Upper Extremity:    Deltoid  5/5   Deltoid  4/5   Biceps  5/5   Biceps  4/5   Triceps  5/5   Triceps  4/5   Wrist extensors  5/5   Wrist extensors  4/5  Wrist flexors  5/5   Wrist flexors  4/5   Finger extensors  5/5   Finger extensors  4/5   Finger flexors  5/5   Finger flexors  4/5   Dorsal interossei  5/5   Dorsal interossei  4/5   Abductor pollicis  5/5   Abductor pollicis  4/5   Tone (Ashworth scale)  0  Tone (Ashworth scale)  0   Right Lower Extremity:    Left Lower Extremity:    Hip flexors  5/5   Hip flexors  4/5   Hip extensors  5/5   Hip extensors  4/5   Knee flexors  5/5   Knee flexors  4/5   Knee extensors  5/5   Knee extensors  4/5   Dorsiflexors  5/5   Dorsiflexors  4/5   Plantarflexors  5/5   Plantarflexors  4/5   Toe extensors  5/5   Toe extensors  4/5   Toe flexors  5/5   Toe flexors  4/5   Tone (Ashworth scale)  0+  Tone (Ashworth scale)  1   MSRs:  Right                                                                 Left brachioradialis 2+  brachioradialis 2+  biceps 2+  biceps 2+  triceps 2+  triceps 2+  patellar 1+  patellar 1+  ankle jerk 2+  ankle jerk 2+  Hoffman no  Hoffman no  plantar response down  plantar response up   SENSORY:  Pin prick reduced over the left thumb and forearm as well as dorsum of the left foot.  Vibration and temperature reduced in the left lower extremity.  Romberg's sign absent.   COORDINATION/GAIT: Mild dysmetria with left finger-to- nose-finger and heel-to-shin testing.  Slowed finger tapping on the left. Able to rise from a chair without using arms.  Gait narrow based and stable. Tandem and stressed gait intact.    IMPRESSION/PLAN: Mrs. Pendelton is a 39 year-old female  presenting for evaluation of left sided weakness, which is apparent on her exam. She also has increased tone in her legs (L >R) and extensor plantar response on the left with otherwise normal-low reflexes. Cranial nerves are all intact.  Her recent blood work did show vitamin B12 deficiency which can cause subacute combined degeneration of the cord and manifest with myelopathic findings, however, with her lateralized findings, MRI brain needs to be done to exclude demyelinating or other structural disease. Imaging of the cervical cord may also be necessary, pending the results.  For her vitamin B12 deficiency, she will start IM injections with her PCP's office.  She may need additional work-up for malabsorption, but will defer this to her PCP.  Return to clinic in 3-4 weeks   The duration of this appointment visit was 50 minutes of face-to-face time with the patient.  Greater than 50% of this time was spent in counseling, explanation of diagnosis, planning of further management, and coordination of care.   Thank you for allowing me to participate in patient's care.  If I can answer any additional questions, I would be pleased to do so.    Sincerely,    Raguel Kosloski K. Allena Katz, DO

## 2015-03-01 NOTE — Telephone Encounter (Signed)
I checked with Andrea Obrien and she checked with [facility]. They will contact her today about the appt time

## 2015-03-06 ENCOUNTER — Ambulatory Visit
Admission: RE | Admit: 2015-03-06 | Discharge: 2015-03-06 | Disposition: A | Discharge status: Home / Self Care | Payer: Managed Care, Other (non HMO) | Source: Ambulatory Visit | Attending: Family Medicine | Admitting: Family Medicine

## 2015-03-06 DIAGNOSIS — R531 Weakness: Secondary | ICD-10-CM

## 2015-03-06 MED ORDER — GADOBENATE DIMEGLUMINE 529 MG/ML IV SOLN
10.0000 mL | Freq: Once | INTRAVENOUS | Status: AC | PRN
  Administered 2015-03-06: 10 mL via INTRAVENOUS

## 2015-03-07 ENCOUNTER — Ambulatory Visit (INDEPENDENT_AMBULATORY_CARE_PROVIDER_SITE_OTHER): Payer: Managed Care, Other (non HMO) | Admitting: *Deleted

## 2015-03-07 ENCOUNTER — Other Ambulatory Visit: Payer: Self-pay | Admitting: Family Medicine

## 2015-03-07 ENCOUNTER — Ambulatory Visit (INDEPENDENT_AMBULATORY_CARE_PROVIDER_SITE_OTHER): Payer: Managed Care, Other (non HMO) | Admitting: Psychology

## 2015-03-07 ENCOUNTER — Ambulatory Visit: Payer: Managed Care, Other (non HMO) | Admitting: Family Medicine

## 2015-03-07 DIAGNOSIS — F4322 Adjustment disorder with anxiety: Secondary | ICD-10-CM | POA: Diagnosis not present

## 2015-03-07 DIAGNOSIS — E538 Deficiency of other specified B group vitamins: Secondary | ICD-10-CM | POA: Diagnosis not present

## 2015-03-07 MED ORDER — PAROXETINE HCL 20 MG PO TABS: 20.0000 mg | ORAL_TABLET | Freq: Every day | ORAL | Status: DC

## 2015-03-07 MED ORDER — CYANOCOBALAMIN 1000 MCG/ML IJ SOLN
1000.0000 ug | Freq: Once | INTRAMUSCULAR | Status: AC
  Administered 2015-03-07: 1000 ug via INTRAMUSCULAR
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
## 2015-03-13 ENCOUNTER — Encounter: Payer: Self-pay | Admitting: Family Medicine

## 2015-03-13 NOTE — Telephone Encounter (Signed)
I spoke with pt and went over results, also she in on the schedule to see Dr. Selena BattenKim on 03/14/15.

## 2015-03-14 ENCOUNTER — Ambulatory Visit (INDEPENDENT_AMBULATORY_CARE_PROVIDER_SITE_OTHER): Payer: Managed Care, Other (non HMO) | Admitting: Psychology

## 2015-03-14 ENCOUNTER — Ambulatory Visit (INDEPENDENT_AMBULATORY_CARE_PROVIDER_SITE_OTHER): Payer: Managed Care, Other (non HMO) | Admitting: *Deleted

## 2015-03-14 DIAGNOSIS — E538 Deficiency of other specified B group vitamins: Secondary | ICD-10-CM | POA: Diagnosis not present

## 2015-03-14 DIAGNOSIS — F4322 Adjustment disorder with anxiety: Secondary | ICD-10-CM | POA: Diagnosis not present

## 2015-03-14 MED ORDER — CYANOCOBALAMIN 1000 MCG/ML IJ SOLN
1000.0000 ug | Freq: Once | INTRAMUSCULAR | Status: AC
  Administered 2015-03-14: 1000 ug via INTRAMUSCULAR

## 2015-03-19 ENCOUNTER — Other Ambulatory Visit: Payer: Self-pay | Admitting: Family Medicine

## 2015-03-19 ENCOUNTER — Telehealth: Payer: Self-pay | Admitting: *Deleted

## 2015-03-19 MED ORDER — LORAZEPAM 0.5 MG PO TABS: 0.5000 mg | ORAL_TABLET | Freq: Three times a day (TID) | ORAL | Status: DC | PRN

## 2015-03-19 NOTE — Telephone Encounter (Signed)
Printed Rx for Lorazepam was called to [facility] at [phone] and I left a message at her cell number with this information.

## 2015-03-21 ENCOUNTER — Ambulatory Visit: Payer: Managed Care, Other (non HMO) | Admitting: Psychology

## 2015-03-28 ENCOUNTER — Ambulatory Visit (INDEPENDENT_AMBULATORY_CARE_PROVIDER_SITE_OTHER): Payer: Managed Care, Other (non HMO) | Admitting: Family Medicine

## 2015-03-28 ENCOUNTER — Ambulatory Visit: Payer: Managed Care, Other (non HMO) | Admitting: *Deleted

## 2015-03-28 ENCOUNTER — Encounter: Payer: Self-pay | Admitting: Family Medicine

## 2015-03-28 VITALS — BP 102/74 | HR 74 | Temp 98.1°F | Ht 62.75 in | Wt 113.8 lb

## 2015-03-28 DIAGNOSIS — D509 Iron deficiency anemia, unspecified: Secondary | ICD-10-CM

## 2015-03-28 DIAGNOSIS — E538 Deficiency of other specified B group vitamins: Secondary | ICD-10-CM | POA: Diagnosis not present

## 2015-03-28 DIAGNOSIS — R202 Paresthesia of skin: Secondary | ICD-10-CM

## 2015-03-28 DIAGNOSIS — F411 Generalized anxiety disorder: Secondary | ICD-10-CM | POA: Diagnosis not present

## 2015-03-28 MED ORDER — CYANOCOBALAMIN 1000 MCG/ML IJ SOLN
1000.0000 ug | Freq: Once | INTRAMUSCULAR | Status: AC
  Administered 2015-03-28: 1000 ug via INTRAMUSCULAR
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
## 2015-03-28 NOTE — Progress Notes (Signed)
Pre visit review using our clinic review tool, if applicable. No additional management support is needed unless otherwise documented below in the visit note. 

## 2015-03-28 NOTE — Addendum Note (Signed)
Addended by: Johnella MoloneyFUNDERBURK, Chabeli Barsamian A on: 03/28/2015 11:43 AM   Modules accepted: Orders

## 2015-04-04 ENCOUNTER — Ambulatory Visit (INDEPENDENT_AMBULATORY_CARE_PROVIDER_SITE_OTHER): Payer: Managed Care, Other (non HMO) | Admitting: *Deleted

## 2015-04-04 ENCOUNTER — Ambulatory Visit (INDEPENDENT_AMBULATORY_CARE_PROVIDER_SITE_OTHER): Payer: Managed Care, Other (non HMO) | Admitting: Psychology

## 2015-04-04 DIAGNOSIS — E538 Deficiency of other specified B group vitamins: Secondary | ICD-10-CM | POA: Diagnosis not present

## 2015-04-04 DIAGNOSIS — F4322 Adjustment disorder with anxiety: Secondary | ICD-10-CM | POA: Diagnosis not present

## 2015-04-04 MED ORDER — CYANOCOBALAMIN 1000 MCG/ML IJ SOLN
1000.0000 ug | Freq: Once | INTRAMUSCULAR | Status: AC
  Administered 2015-04-04: 1000 ug via INTRAMUSCULAR

## 2015-04-09 ENCOUNTER — Ambulatory Visit: Payer: Self-pay | Admitting: Neurology

## 2015-04-10 ENCOUNTER — Telehealth: Payer: Self-pay | Admitting: *Deleted

## 2015-04-10 ENCOUNTER — Encounter: Payer: Self-pay | Admitting: *Deleted

## 2015-04-10 NOTE — Telephone Encounter (Signed)
No show letter sent for 04/09/2015

## 2015-04-11 ENCOUNTER — Ambulatory Visit (INDEPENDENT_AMBULATORY_CARE_PROVIDER_SITE_OTHER): Payer: Managed Care, Other (non HMO) | Admitting: *Deleted

## 2015-04-11 DIAGNOSIS — E538 Deficiency of other specified B group vitamins: Secondary | ICD-10-CM

## 2015-04-11 MED ORDER — CYANOCOBALAMIN 1000 MCG/ML IJ SOLN
1000.0000 ug | Freq: Once | INTRAMUSCULAR | Status: AC
  Administered 2015-04-11: 1000 ug via INTRAMUSCULAR

## 2015-04-18 ENCOUNTER — Ambulatory Visit (INDEPENDENT_AMBULATORY_CARE_PROVIDER_SITE_OTHER): Payer: Managed Care, Other (non HMO) | Admitting: *Deleted

## 2015-04-18 ENCOUNTER — Ambulatory Visit: Payer: Managed Care, Other (non HMO) | Admitting: Psychology

## 2015-04-18 DIAGNOSIS — E538 Deficiency of other specified B group vitamins: Secondary | ICD-10-CM

## 2015-04-18 MED ORDER — CYANOCOBALAMIN 1000 MCG/ML IJ SOLN
1000.0000 ug | Freq: Once | INTRAMUSCULAR | Status: AC
  Administered 2015-04-18: 1000 ug via INTRAMUSCULAR

## 2015-04-20 ENCOUNTER — Telehealth: Payer: Self-pay | Admitting: Family Medicine

## 2015-04-20 NOTE — Telephone Encounter (Signed)
Appointments cancelled.

## 2015-04-20 NOTE — Telephone Encounter (Signed)
[phone] (home)  Talked with pt. She opted to try sublingual B12 daily. Has lab appointment se to check labs.   Fleet Contras, please cancel any inj visits that may be set at this point. Thanks.

## 2015-04-20 NOTE — Telephone Encounter (Signed)
Patient called in returning a phone call from Dante. Patient wants to know how does she go about giving her b12 injection to herself. Patient states she is being charged a copay for her injections. Please give patient a call back.

## 2015-04-25 ENCOUNTER — Other Ambulatory Visit: Payer: Self-pay

## 2015-04-25 ENCOUNTER — Ambulatory Visit: Payer: Self-pay | Admitting: *Deleted

## 2015-04-25 MED ORDER — PAROXETINE HCL 20 MG PO TABS: 20.0000 mg | ORAL_TABLET | Freq: Every day | ORAL | Status: DC

## 2015-04-25 NOTE — Telephone Encounter (Signed)
Rx re-sent due to e-scribe transmission error.

## 2015-05-02 ENCOUNTER — Ambulatory Visit: Payer: Managed Care, Other (non HMO) | Admitting: Psychology

## 2015-05-02 ENCOUNTER — Ambulatory Visit: Payer: Self-pay | Admitting: *Deleted

## 2015-05-09 ENCOUNTER — Ambulatory Visit: Payer: Self-pay | Admitting: *Deleted

## 2015-05-16 ENCOUNTER — Ambulatory Visit: Payer: Self-pay | Admitting: *Deleted

## 2015-05-23 ENCOUNTER — Ambulatory Visit: Payer: Self-pay | Admitting: *Deleted

## 2015-05-30 ENCOUNTER — Ambulatory Visit: Payer: Self-pay | Admitting: *Deleted

## 2015-06-04 ENCOUNTER — Ambulatory Visit: Payer: Managed Care, Other (non HMO) | Admitting: Family Medicine

## 2015-06-04 NOTE — Progress Notes (Signed)
NO SHOW

## 2015-06-06 ENCOUNTER — Ambulatory Visit: Payer: Self-pay | Admitting: *Deleted

## 2015-08-01 ENCOUNTER — Telehealth: Payer: Self-pay | Admitting: Family Medicine
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
## 2015-08-06 NOTE — Telephone Encounter (Signed)
Email sent to charge correction.

## 2015-11-09 ENCOUNTER — Ambulatory Visit (INDEPENDENT_AMBULATORY_CARE_PROVIDER_SITE_OTHER): Payer: Managed Care, Other (non HMO) | Admitting: Psychology

## 2015-11-09 DIAGNOSIS — F4322 Adjustment disorder with anxiety: Secondary | ICD-10-CM

## 2015-11-15 ENCOUNTER — Ambulatory Visit (INDEPENDENT_AMBULATORY_CARE_PROVIDER_SITE_OTHER): Payer: Managed Care, Other (non HMO) | Admitting: Psychology

## 2015-11-15 DIAGNOSIS — F4322 Adjustment disorder with anxiety: Secondary | ICD-10-CM | POA: Diagnosis not present

## 2015-11-22 ENCOUNTER — Ambulatory Visit (INDEPENDENT_AMBULATORY_CARE_PROVIDER_SITE_OTHER): Payer: Managed Care, Other (non HMO) | Admitting: Psychology

## 2015-11-22 DIAGNOSIS — F4322 Adjustment disorder with anxiety: Secondary | ICD-10-CM

## 2015-12-03 ENCOUNTER — Ambulatory Visit: Payer: Managed Care, Other (non HMO) | Admitting: Psychology

## 2016-12-30 ENCOUNTER — Encounter (HOSPITAL_COMMUNITY): Payer: Self-pay

## 2016-12-30 ENCOUNTER — Inpatient Hospital Stay (HOSPITAL_COMMUNITY)
Admission: AD | Admit: 2016-12-30 | Discharge: 2016-12-30 | Disposition: A | Discharge status: Home / Self Care | Payer: BLUE CROSS/BLUE SHIELD | Source: Ambulatory Visit | Attending: Obstetrics and Gynecology | Admitting: Obstetrics and Gynecology

## 2016-12-30 DIAGNOSIS — R102 Pelvic and perineal pain: Secondary | ICD-10-CM | POA: Insufficient documentation

## 2016-12-30 DIAGNOSIS — Z79899 Other long term (current) drug therapy: Secondary | ICD-10-CM | POA: Diagnosis not present

## 2016-12-30 DIAGNOSIS — G8918 Other acute postprocedural pain: Secondary | ICD-10-CM | POA: Diagnosis not present

## 2016-12-30 HISTORY — PX: ABLATION: SHX5711

## 2016-12-30 LAB — URINALYSIS, ROUTINE W REFLEX MICROSCOPIC
BILIRUBIN URINE: NEGATIVE
Glucose, UA: NEGATIVE mg/dL
KETONES UR: 20 mg/dL — AB
LEUKOCYTES UA: NEGATIVE
NITRITE: NEGATIVE
PH: 5 (ref 5.0–8.0)
Protein, ur: NEGATIVE mg/dL
SPECIFIC GRAVITY, URINE: 1.012 (ref 1.005–1.030)

## 2016-12-30 MED ORDER — HYDROMORPHONE HCL 1 MG/ML IJ SOLN
1.0000 mg | Freq: Once | INTRAMUSCULAR | Status: AC
  Administered 2016-12-30: 1 mg via INTRAMUSCULAR
  Filled 2016-12-30: qty 1

## 2016-12-30 MED ORDER — HYDROMORPHONE HCL 1 MG/ML IJ SOLN: 1.0000 mg | Freq: Once | INTRAMUSCULAR | Status: DC

## 2016-12-30 MED ORDER — KETOROLAC TROMETHAMINE 30 MG/ML IJ SOLN
30.0000 mg | Freq: Once | INTRAMUSCULAR | Status: DC
  Filled 2016-12-30: qty 1

## 2016-12-30 MED ORDER — KETOROLAC TROMETHAMINE 30 MG/ML IJ SOLN
30.0000 mg | Freq: Once | INTRAMUSCULAR | Status: AC
  Administered 2016-12-30: 30 mg via INTRAMUSCULAR

## 2016-12-30 NOTE — MAU Note (Signed)
Patient had an ablation in the office today at 10:30. Patient started to have extreme pain around 12:30. Patient has taken pain meds since and has not had any relief.

## 2016-12-30 NOTE — MAU Provider Note (Signed)
History     CSN: [id number]  Arrival date and time: 12/30/16 1451   First Provider Initiated Contact with Patient 12/30/16 1532      Chief Complaint  Patient presents with  . Abdominal Pain   HPI  Pt presents with c/o severe pelvic pain and cramping.  s/p hysteroscopy w/ Novasure ablation this morning in the office.  No pain relief with PO percocet and ibuprofen.  Upon arrival reports pain 10/10.  No n/v/d.  No fevers   Past Medical History:  Diagnosis Date  . Anemia    reports since she was a teenager, iron def per her report, reports had work up and told to take iron    Past Surgical History:  Procedure Laterality Date  . ABLATION    . WISDOM TOOTH EXTRACTION      Family History  Problem Relation Age of Onset  . Fibromyalgia Mother     Living  . Prostate cancer Father     Living  . Heart disease Maternal Grandmother   . Hypertension Maternal Grandmother   . Arthritis Paternal Grandmother   . Cancer Paternal Grandfather     prostate  . Multiple sclerosis Maternal Aunt   . Healthy Brother   . Healthy Son     x 2     Social History  Substance Use Topics  . Smoking status: Never Smoker  . Smokeless tobacco: Never Used  . Alcohol use No    Allergies: No Known Allergies  Prescriptions Prior to Admission  Medication Sig Dispense Refill Last Dose  . Cholecalciferol (VITAMIN D) 2000 units CAPS Take 1 capsule by mouth daily.   12/29/2016 at Unknown time  . citalopram (CELEXA) 10 MG tablet Take 10 mg by mouth daily.   12/29/2016 at Unknown time  . Cyanocobalamin (VITAMIN B 12 PO) Take 1 tablet by mouth daily.   12/29/2016 at Unknown time  . FERROUS SULFATE PO Take 1 tablet by mouth daily.    12/29/2016 at Unknown time  . ibuprofen (ADVIL,MOTRIN) 800 MG tablet Take 800 mg by mouth 3 (three) times daily as needed for moderate pain.    has not started  . misoprostol (CYTOTEC) 200 MCG tablet Place 1 tablet vaginally once.   12/29/2016 at Unknown time  . Multiple Vitamin  (MULTIVITAMIN WITH MINERALS) TABS tablet Take 1 tablet by mouth daily.   12/29/2016 at Unknown time  . Omega-3 Fatty Acids (FISH OIL PO) Take 1 capsule by mouth daily.   12/29/2016 at Unknown time  . oxyCODONE-acetaminophen (PERCOCET/ROXICET) 5-325 MG tablet Take 1 tablet by mouth every 6 (six) hours as needed for severe pain.    12/30/2016 at Unknown time  . tretinoin (RETIN-A) 0.025 % cream Apply 1 application topically every evening.  3 12/29/2016 at Unknown time  . Vitamin D, Ergocalciferol, (DRISDOL) 50000 units CAPS capsule Take 50,000 Units by mouth every 7 (seven) days. Wednesday   Past Week at Unknown time    Review of Systems Physical Exam   Blood pressure 109/60, pulse 80, temperature 97.8 F (36.6 C), temperature source Oral, resp. rate 18.  Physical Exam  gen - very uncomfortable upon arrival Abd - soft, ND Ext - no edema PV - deferred  MAU Course  Procedures   Pt given IM toradol and dilaudid.  Pain relieved.  Pt reports she is feeling much better & ready to be discharged.    Assessment and Plan  Post operative pain Resolved with IM medications Pt to continue percocet and ibuprofen  prn Will follow up with office as scheduled.  [name] 12/30/2016, 5:23 PM

## 2017-01-09 IMAGING — MR MR HEAD WO/W CM
9 of 12 series · 34 of 48 positions shown · IV contrast (multihance)
Comparison: None.

CLINICAL DATA: 39-year-old female with left-sided body weakness and
numbness. Confusion for 2 weeks. Diplopia that lasted for 20
minutes. Anemia. Family history of multiple sclerosis. Initial
encounter.

EXAM:
MRI HEAD WITHOUT AND WITH CONTRAST
TECHNIQUE: Multiplanar, multiecho pulse sequences of the brain and surrounding
structures were obtained without and with intravenous contrast.
CONTRAST:  10mL MULTIHANCE GADOBENATE DIMEGLUMINE 529 MG/ML IV SOLN

[Series 3: FLAIR · sagittal · 5.0mm · 0.47mm/px · 2 of 25 slices shown (1 of 2)]
[im 1/25]
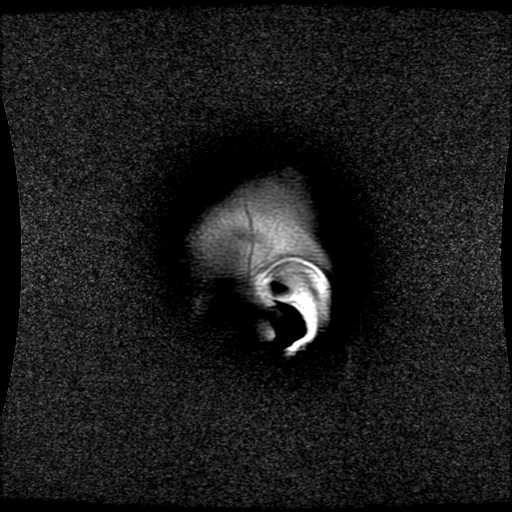
[im 25/25]
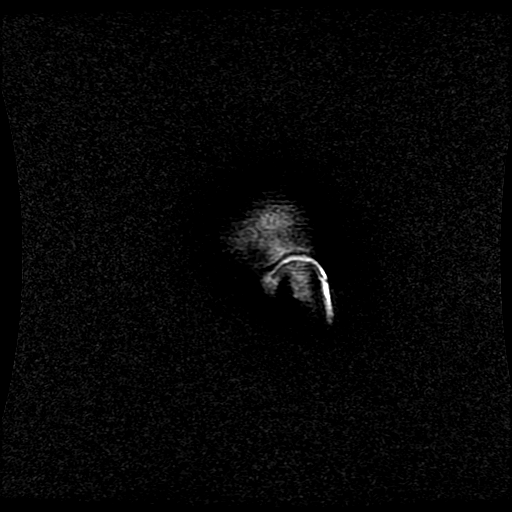

[Series 4: DWI · axial · 3.0mm · 1.09mm/px · z∈[-81,+62]mm · 8 of 100 slices shown (1 of 4)]
[im 1/100]
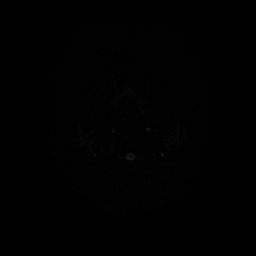
[im 12/100]
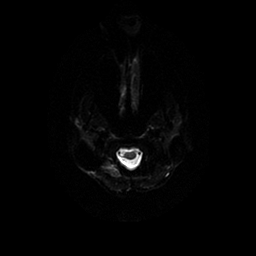
[im 34/100]
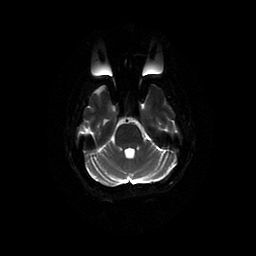
[im 45/100]
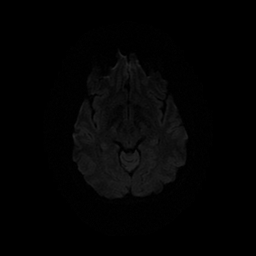
[im 56/100]
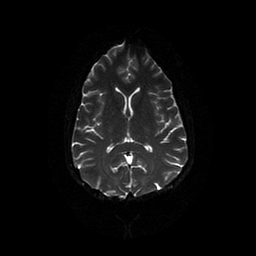
[im 67/100]
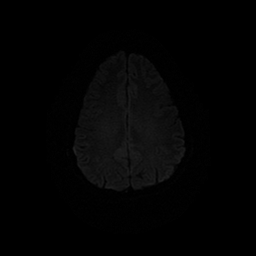
[im 89/100]
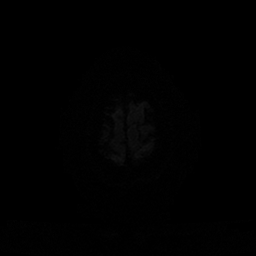
[im 100/100]
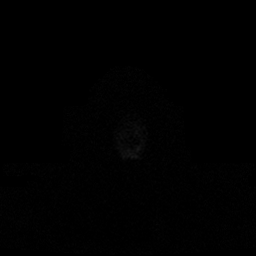

[Series 5: DWI · coronal · 5.0mm · 1.09mm/px · 6 of 66 slices shown (2 of 4)]
[im 1/66]
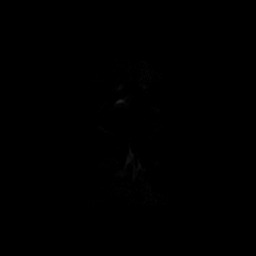
[im 14/66]
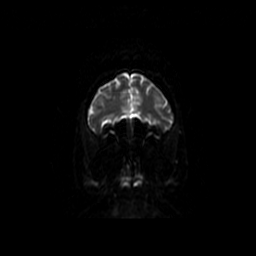
[im 27/66]
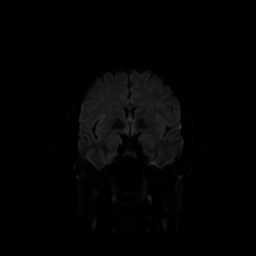
[im 40/66]
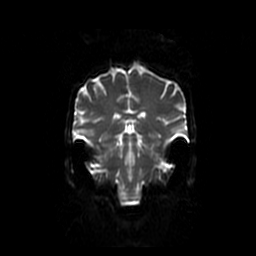
[im 53/66]
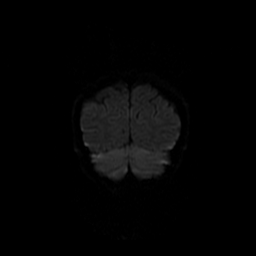
[im 66/66]
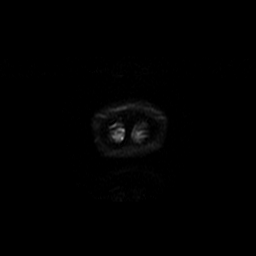

[Series 6: T2-star · axial · 5.0mm · 0.43mm/px · z∈[-73,+65]mm · 2 of 25 slices shown]
[im 1/25]
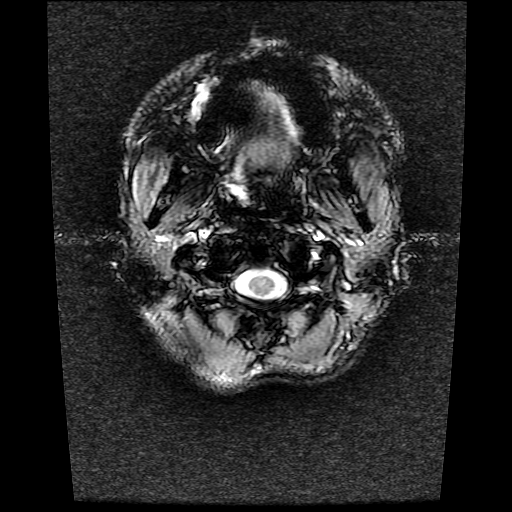
[im 25/25]
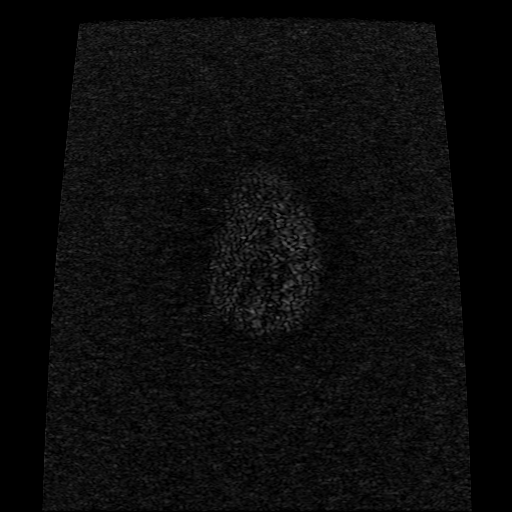

[Series 8: FLAIR · axial · 5.0mm · 0.43mm/px · z∈[-73,+65]mm · 2 of 25 slices shown (2 of 2)]
[im 1/25]
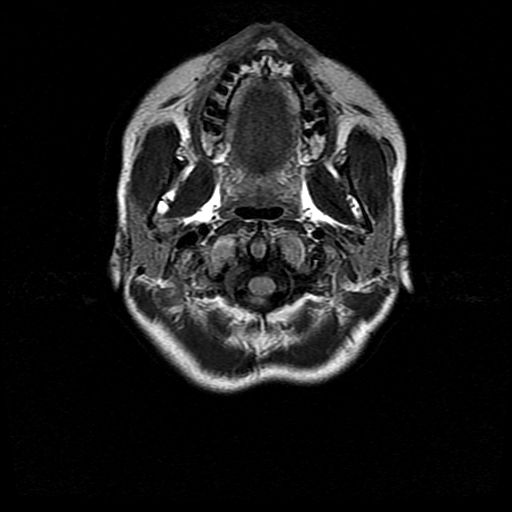
[im 25/25]
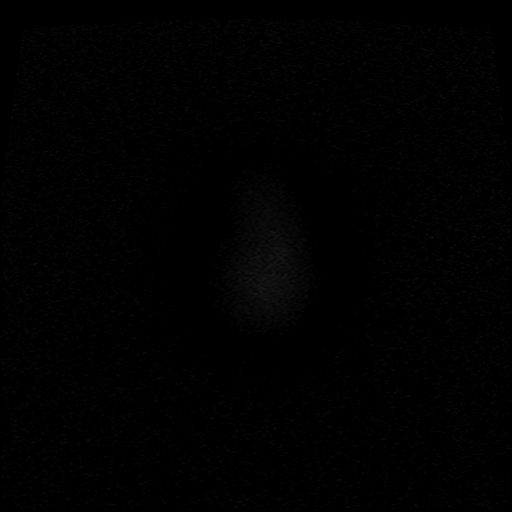

[Series 10: T2 post-contrast · coronal · 5.0mm · 0.43mm/px · 3 of 28 slices shown]
[im 1/28]
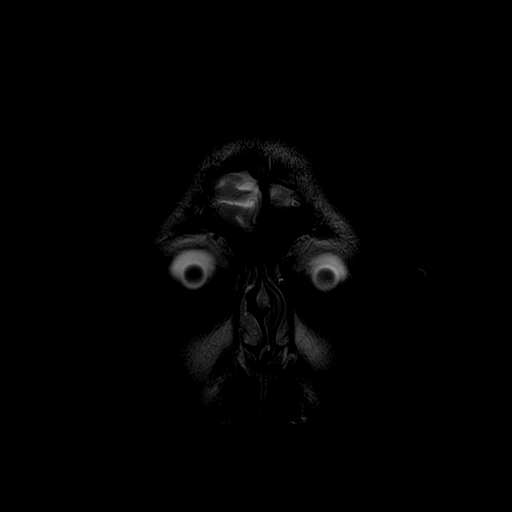
[im 14/28]
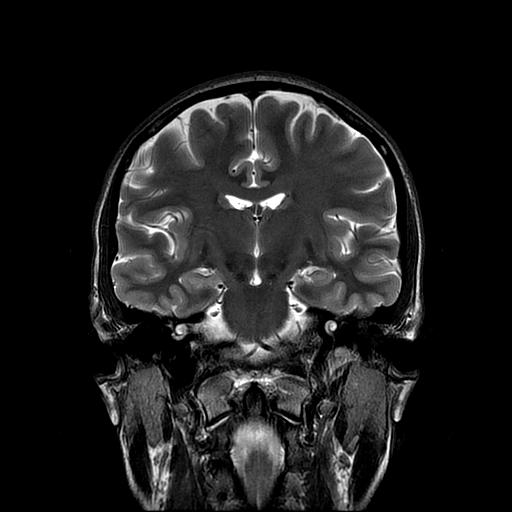
[im 28/28]
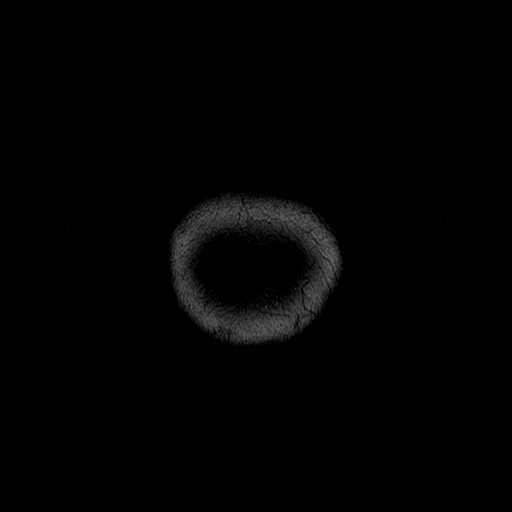

[Series 12: T1 · coronal · 5.0mm · 0.43mm/px · 3 of 28 slices shown]
[im 1/28]
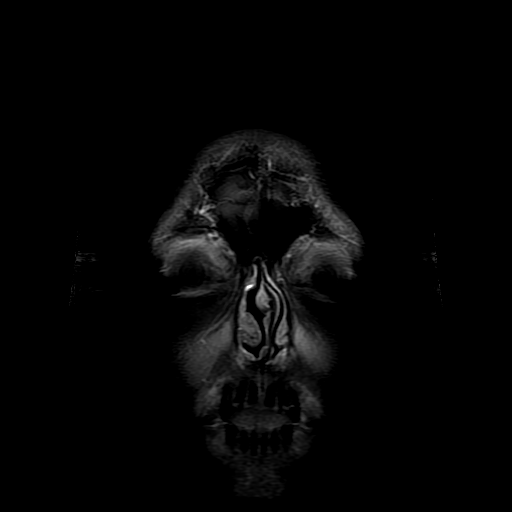
[im 14/28]
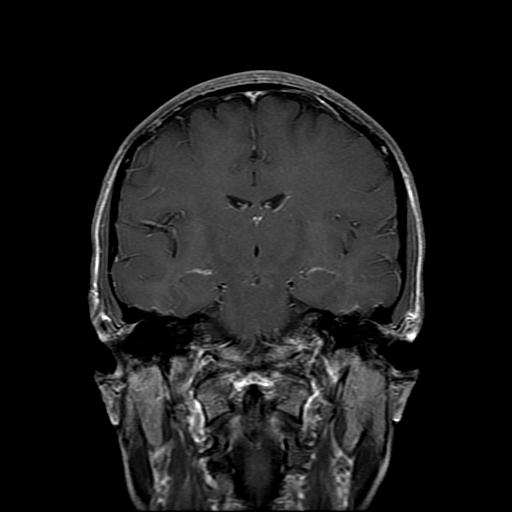
[im 28/28]
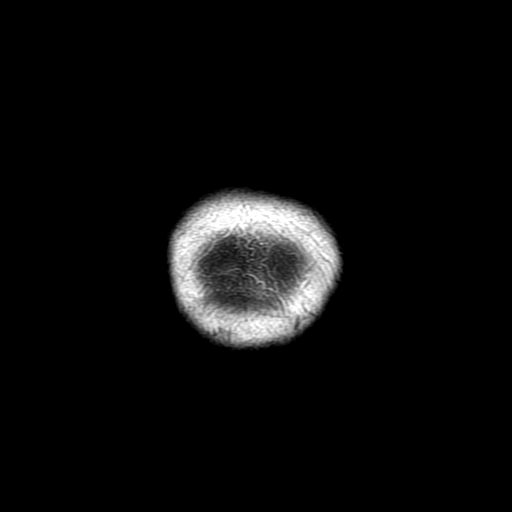

[Series 400: DWI · axial · 3.0mm · 1.09mm/px · z∈[-81,+62]mm · 5 of 50 slices shown (3 of 4)]
[im 1/50]
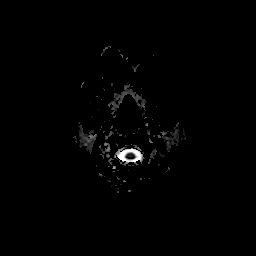
[im 13/50]
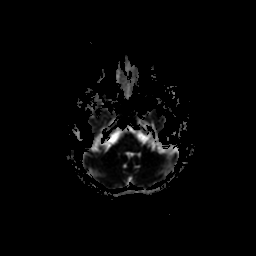
[im 25/50]
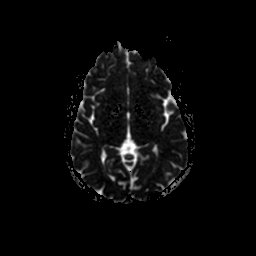
[im 37/50]
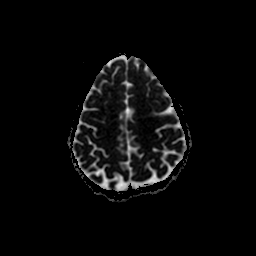
[im 50/50]
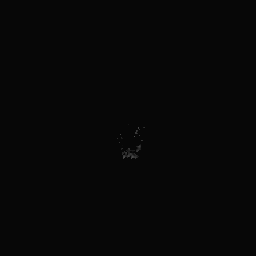

[Series 500: DWI · coronal · 5.0mm · 1.09mm/px · 3 of 33 slices shown (4 of 4)]
[im 1/33]
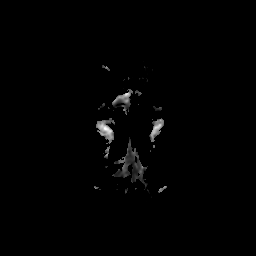
[im 17/33]
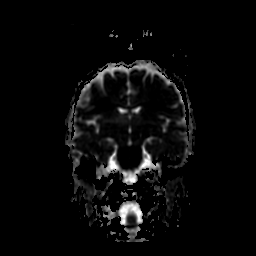
[im 33/33]
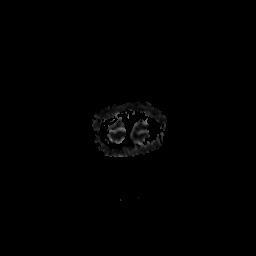

[34 of 48 positions shown; findings below may reference images not displayed]

FINDINGS: No acute infarct.

No intracranial hemorrhage.

No focal area of altered signal intensity to suggest presence of
demyelinating process.

Tiny developmental venous anomaly inferior right cerebellum
incidentally noted. No intracranial mass or abnormal enhancement
otherwise detected.

Major intracranial vascular structures are patent.

Orbital structures unremarkable.

Slightly prominent appearance of the growth arrest line of the
clivus without mass detected.

Pituitary and pineal region possible cervical medullary junction was
of normal limits.
IMPRESSION: No focal area of altered signal intensity to suggest presence of
demyelinating process.

No acute infarct.

Tiny developmental venous anomaly inferior right cerebellum
incidentally noted.

## 2017-02-05 ENCOUNTER — Ambulatory Visit (INDEPENDENT_AMBULATORY_CARE_PROVIDER_SITE_OTHER): Payer: BLUE CROSS/BLUE SHIELD | Admitting: Family Medicine

## 2017-02-05 ENCOUNTER — Encounter: Payer: Self-pay | Admitting: Family Medicine

## 2017-02-05 VITALS — BP 100/70 | HR 77 | Temp 98.2°F | Ht 62.75 in | Wt 119.3 lb

## 2017-02-05 DIAGNOSIS — M79672 Pain in left foot: Secondary | ICD-10-CM

## 2017-02-05 NOTE — Progress Notes (Signed)
HPI:  Acute visit for L foot pain: -runs about 15 miles per week, not a new activity for her -pain in L ant medial foot for about 4 weeks with swelling initially -had modified activity, tried ice and nsaids, but still hurting -swelling resolved, pain improved -no known trauma, weakness, numbness, redness, heat, falls, fevers, malaise -worried about a fracture -no recent change in foot wear  Note: not in for some time. No longer on celexa, mood good. Dx with mild hypothyroidism and now on low dose (? levothyroxine w/ gyn) - reports gyn checking levels. Taking Vit D.  ROS: See pertinent positives and negatives per HPI.  Past Medical History:  Diagnosis Date  . Anemia    reports since she was a teenager, iron def per her report, reports had work up and told to take iron  . Hypothyroidism    diagnosed by Dr Vinnie Langton Adkins-per patient    Past Surgical History:  Procedure Laterality Date  . ABLATION    . ABLATION  12/30/2016   Dr Zelphia Cairo per patient  . WISDOM TOOTH EXTRACTION      Family History  Problem Relation Age of Onset  . Fibromyalgia Mother        Living  . Prostate cancer Father        Living  . Heart disease Maternal Grandmother   . Hypertension Maternal Grandmother   . Arthritis Paternal Grandmother   . Cancer Paternal Grandfather        prostate  . Multiple sclerosis Maternal Aunt   . Healthy Brother   . Healthy Son        x 2     Social History   Social History  . Marital status: Married    Spouse name: N/A  . Number of children: N/A  . Years of education: N/A   Social History Main Topics  . Smoking status: Never Smoker  . Smokeless tobacco: Never Used  . Alcohol use No  . Drug use: No  . Sexual activity: Not Asked   Other Topics Concern  . 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  Cholecalciferol (VITAMIN D) 2000 units CAPS, Take 1 capsule by mouth daily., Disp: , Rfl:  .  Cyanocobalamin (VITAMIN B 12 PO), Take 1 tablet by mouth daily., Disp: , Rfl:  .  FERROUS SULFATE PO, Take 1 tablet by mouth daily. , Disp: , Rfl:  .  ibuprofen (ADVIL,MOTRIN) 800 MG tablet, Take 800 mg by mouth 3 (three) times daily as needed for moderate pain. , Disp: , Rfl:  .  levothyroxine (SYNTHROID, LEVOTHROID) 25 MCG tablet, Take 12.5 mcg by mouth daily., Disp: , Rfl: 1 .  misoprostol (CYTOTEC) 200 MCG tablet, Place 1 tablet vaginally once., Disp: , Rfl:  .  Multiple Vitamin (MULTIVITAMIN WITH MINERALS) TABS tablet, Take 1 tablet by mouth daily., Disp: , Rfl:  .  Omega-3 Fatty Acids (FISH OIL PO), Take 1 capsule by mouth daily., Disp: , Rfl:  .  tretinoin (RETIN-A) 0.025 % cream,  Apply 1 application topically every evening., Disp: , Rfl: 3 .  Vitamin D, Ergocalciferol, (DRISDOL) 50000 units CAPS capsule, Take 50,000 Units by mouth every 7 (seven) days. Wednesday, Disp: , Rfl:   EXAM:  Vitals:   02/05/17 0844  BP: 100/70  Pulse: 77  Temp: 98.2 F (36.8 C)    Body mass index is 21.3 kg/m.  GENERAL: vitals reviewed and listed above, alert, oriented, appears well hydrated and in no acute distress  HEENT: atraumatic, conjunttiva clear, no obvious abnormalities on inspection of external nose and ears  NECK: no obvious masses on inspection  MS: moves all extremities without noticeable abnormality, gait normal TTP distal 1st L MT, otherwise normal exam of the feet. No swelling or redness. No other TTP. Normal ROM, NV intact distal.  PSYCH: pleasant and cooperative, no obvious depression or anxiety  ASSESSMENT AND PLAN:  Discussed the following assessment and plan:  Left  foot pain - Plan: DG Foot Complete Left  -we discussed possible serious and likely etiologies, workup and treatment, treatment risks and return precautions - OA, tendinitis vs small fx all possible vs other. -after this discussion, Nailah opted for plain films, cont rest, ice, top treatments for pain -follow up advised 3-4 weeks -of course, we advised Aqsa  to return or notify a doctor immediately if symptoms worsen or persist or new concerns arise.  Patient Instructions  BEFORE YOU LEAVE: -follow up: 3-4 weeks -xray sheet  Go get the xray.  Rest - no pounding or strenuous activities for the foot. Ok to cycle if does not aggravate. Ice and/or tiger balm as need for pain.  Continue vit Marnee Spring.    Sunaina Ferrando R., DO

## 2017-02-05 NOTE — Patient Instructions (Addendum)
BEFORE YOU LEAVE: -follow up: 3-4 weeks -xray sheet  Go get the xray.  Rest - no pounding or strenuous activities for the foot. Ok to cycle if does not aggravate. Ice and/or tiger balm as need for pain.  Continue vit D.

## 2017-02-26 ENCOUNTER — Ambulatory Visit: Payer: Self-pay | Admitting: Family Medicine

## 2017-03-12 ENCOUNTER — Encounter: Payer: Self-pay | Admitting: Family Medicine

## 2017-03-13 ENCOUNTER — Other Ambulatory Visit: Payer: Self-pay | Admitting: *Deleted

## 2017-03-13 MED ORDER — LEVOTHYROXINE SODIUM 25 MCG PO TABS: 25.0000 ug | ORAL_TABLET | Freq: Every day | ORAL | 0 refills | Status: DC

## 2017-03-13 NOTE — Telephone Encounter (Signed)
I called the pt and informed her of the message below.  She stated she is not able to come in today due to practice with her son and an appt was scheduled for 7/19 and she is aware the Rx was sent to her pharmacy.

## 2017-03-13 NOTE — Telephone Encounter (Signed)
Rx done. 

## 2017-03-26 ENCOUNTER — Ambulatory Visit: Payer: Self-pay | Admitting: Family Medicine

## 2017-05-28 ENCOUNTER — Encounter: Payer: Self-pay | Admitting: Family Medicine

## 2017-06-08 ENCOUNTER — Other Ambulatory Visit: Payer: Self-pay | Admitting: Family Medicine

## 2017-07-18 ENCOUNTER — Other Ambulatory Visit: Payer: Self-pay | Admitting: Family Medicine

## 2017-07-22 ENCOUNTER — Other Ambulatory Visit: Payer: Self-pay | Admitting: Family Medicine

## 2017-08-06 ENCOUNTER — Ambulatory Visit: Payer: Self-pay | Admitting: *Deleted

## 2017-08-06 NOTE — Telephone Encounter (Signed)
  Reason for Disposition . [1] MILD dizziness (e.g., walking normally) AND [2] has NOT been evaluated by physician for this  (Exception: dizziness caused by heat exposure, sudden standing, or poor fluid intake)  Answer Assessment - Initial Assessment Questions 1. SYMPTOMS: "Do you have any symptoms?"     Faint, nausea, dizziness 2. SEVERITY: If symptoms are present, ask "Are they mild, moderate or severe?"     Patient states she has waves of nausea- feels off  Answer Assessment - Initial Assessment Questions 1. 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 LIGHTHEADED: "Do you feel lightheaded?" (e.g., somewhat faint, woozy, weak upon standing)     None today- this morning a little- patient is not comfortable driving today 3. VERTIGO: "Do you feel like either you or the room is spinning or tilting?" (i.e. vertigo)     yes 4. SEVERITY: "How bad is it?"  "Do you feel like you are going to faint?" "Can you stand and walk?"   - MILD - walking normally   - MODERATE - interferes with normal activities (e.g., work, school)    - SEVERE - unable to stand, requires support to walk, feels like passing out now.      Better today 5. ONSET:  "When did the dizziness begin?"     Yesterday- off and on 6. AGGRAVATING FACTORS: "Does anything make it worse?" (e.g., standing, change in head position)     Sitting up too fast 7. HEART RATE: "Can you tell me your heart rate?" "How many beats in 15 seconds?"  (Note: not all patients can do this)       Pulse was racing- did not count  BP was low 8. CAUSE: "What do you think is causing the dizziness?"     Patient thinks it may have been a reaction to forgetting her thyroid medication- but she has forgotten in past and not reacted so severely 9. RECURRENT SYMPTOM: "Have you had dizziness before?" If so, ask: "When was the last time?" "What happened that time?"     Dizziness was new- usually patient has nausea 10.  OTHER SYMPTOMS: "Do you have any other symptoms?" (e.g., fever, chest pain, vomiting, diarrhea, bleeding)       Nausea, cold and faint 11. PREGNANCY: "Is there any chance you are pregnant?" "When was your last menstrual period?"       No-vasectomy/ablation  Protocols used: DIZZINESS - LIGHTHEADEDNESS-A-AH, MEDICATION QUESTION CALL-A-AH

## 2017-08-07 ENCOUNTER — Encounter: Payer: Self-pay | Admitting: Family Medicine

## 2017-08-07 ENCOUNTER — Ambulatory Visit (INDEPENDENT_AMBULATORY_CARE_PROVIDER_SITE_OTHER): Payer: BLUE CROSS/BLUE SHIELD | Admitting: Family Medicine

## 2017-08-07 VITALS — BP 100/60 | HR 75 | Temp 97.8°F | Ht 62.75 in | Wt 127.7 lb

## 2017-08-07 DIAGNOSIS — R42 Dizziness and giddiness: Secondary | ICD-10-CM

## 2017-08-07 DIAGNOSIS — Z23 Encounter for immunization: Secondary | ICD-10-CM

## 2017-08-07 DIAGNOSIS — G44209 Tension-type headache, unspecified, not intractable: Secondary | ICD-10-CM | POA: Diagnosis not present

## 2017-08-07 DIAGNOSIS — Z862 Personal history of diseases of the blood and blood-forming organs and certain disorders involving the immune mechanism: Secondary | ICD-10-CM

## 2017-08-07 DIAGNOSIS — E039 Hypothyroidism, unspecified: Secondary | ICD-10-CM | POA: Diagnosis not present

## 2017-08-07 LAB — CBC
HCT: 35.4 % — ABNORMAL LOW (ref 36.0–46.0)
HEMOGLOBIN: 11.5 g/dL — AB (ref 12.0–15.0)
MCHC: 32.4 g/dL (ref 30.0–36.0)
MCV: 80.1 fl (ref 78.0–100.0)
PLATELETS: 355 10*3/uL (ref 150.0–400.0)
RBC: 4.41 Mil/uL (ref 3.87–5.11)
RDW: 16.3 % — ABNORMAL HIGH (ref 11.5–15.5)
WBC: 6.1 10*3/uL (ref 4.0–10.5)

## 2017-08-07 LAB — TSH: TSH: 2.17 u[IU]/mL (ref 0.35–4.50)

## 2017-08-07 NOTE — Addendum Note (Signed)
Addended by: Johnella MoloneyFUNDERBURK, JO A on: 08/07/2017 10:34 AM   Modules accepted: Orders

## 2017-08-07 NOTE — Progress Notes (Signed)
HPI:  Andrea LedererJanel Obrien is a pleasant 41 year old here for an acute visit for dizziness.  She had a brief episode of dizziness or lightheadedness 2 days ago.  She was sitting on the floor painting with her head down when this occurred.  She had missed several doses of her thyroid medication.  She reports she was started on thyroid medication by her gynecologist earlier this year.  She has a history of low blood pressure and has passed out many times with blood draws, dehydrated and often gets dizzy with standing up too quickly.  This is been going on her whole life.  She also has a history of iron deficiency anemia and has always taken iron.  Reports she had a workup remotely for this.  She tries to avoid salt in her food, exercises on a regular basis and drinks plenty of water usually.  She has not any chest pain, palpitations, dyspnea on exertion exertional chest discomfort, dizziness with exertion, fevers, unusual headaches, vomiting, vision changes, weakness or numbness.  She has had a runny nose, little bit ear issues and a migraine this week.  Her son has been dealing with an upper respiratory illness.  She has not had any trouble breathing.  She has not had any further episodes since.  She wants to check her labs.  She has a history of anxiety.  She has had a little more stress, but has been doing well with that.  She does carry a lot of tension in her shoulders and is seeing massage therapy for that.   ROS: See pertinent positives and negatives per HPI.  Past Medical History:  Diagnosis Date  . Anemia    reports since she was a teenager, iron def per her report, reports had work up and told to take iron  . Hypothyroidism    diagnosed by Dr Vinnie LangtonGretchen Adkins-per patient    Past Surgical History:  Procedure Laterality Date  . ABLATION    . ABLATION  12/30/2016   Dr Zelphia CairoGretchen Adkins per patient  . WISDOM TOOTH EXTRACTION      Family History  Problem Relation Age of Onset  . Fibromyalgia  Mother        Living  . Prostate cancer Father        Living  . Heart disease Maternal Grandmother   . Hypertension Maternal Grandmother   . Arthritis Paternal Grandmother   . Cancer Paternal Grandfather        prostate  . Multiple sclerosis Maternal Aunt   . Healthy Brother   . Healthy Son        x 2     Social History   Socioeconomic History  . Marital status: Married    Spouse name: None  . Number of children: None  . Years of education: None  . Highest education level: None  Social Needs  . Financial resource strain: None  . Food insecurity - worry: None  . Food insecurity - inability: None  . Transportation needs - medical: None  . Transportation needs - non-medical: None  Occupational History  . None  Tobacco Use  . Smoking status: Never Smoker  . Smokeless tobacco: Never Used  Substance and Sexual Activity  . Alcohol use: No    Alcohol/week: 0.0 oz  . Drug use: No  . Sexual activity: None  Other Topics Concern  . 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  Cholecalciferol (VITAMIN D) 2000 units CAPS, Take 1 capsule by mouth daily., Disp: , Rfl:  .  Cyanocobalamin (VITAMIN B 12 PO), Take 1 tablet by mouth daily., Disp: , Rfl:  .  FERROUS SULFATE PO, Take 1 tablet by mouth daily. , Disp: , Rfl:  .  ibuprofen (ADVIL,MOTRIN) 800 MG tablet, Take 800 mg by mouth 3 (three) times daily as needed for moderate pain. , Disp: , Rfl:  .  levothyroxine (SYNTHROID, LEVOTHROID) 25 MCG tablet, TAKE 1 TABLET (25 MCG TOTAL) BY MOUTH DAILY BEFORE BREAKFAST., Disp: 30 tablet, Rfl: 0 .  Multiple Vitamin (MULTIVITAMIN WITH MINERALS) TABS  tablet, Take 1 tablet by mouth daily., Disp: , Rfl:  .  Omega-3 Fatty Acids (FISH OIL PO), Take 1 capsule by mouth daily., Disp: , Rfl:  .  tretinoin (RETIN-A) 0.025 % cream, Apply 1 application topically every evening., Disp: , Rfl: 3 .  Vitamin D, Ergocalciferol, (DRISDOL) 50000 units CAPS capsule, Take 50,000 Units by mouth every 7 (seven) days. Wednesday, Disp: , Rfl:   EXAM:  Vitals:   08/07/17 0925  BP: 100/60  Pulse: 75  Temp: 97.8 F (36.6 C)    Body mass index is 22.8 kg/m.  GENERAL: vitals reviewed and listed above, alert, oriented, appears well hydrated and in no acute distress  HEENT: atraumatic, conjunttiva clear, no obvious abnormalities on inspection of external nose and ears, normal appearance of ear canals and TMs, clear nasal congestion, mild post oropharyngeal erythema with PND, no tonsillar edema or exudate, no sinus TTP  NECK: no obvious masses on inspection, no bruits  LUNGS: clear to auscultation bilaterally, no wheezes, rales or rhonchi, good air movement  CV: HRRR, no peripheral edema  MS: moves all extremities without noticeable abnormality  PSYCH/ NEURO: pleasant and cooperative, no obvious depression or anxiety, cranial nerves II through XII grossly intact, finger to nose normal, gait normal, speech and thought processing grossly intact, Dix-Hallpike negative  ASSESSMENT AND PLAN:  Discussed the following assessment and plan:  Dizziness  Hypothyroidism, unspecified type - Plan: TSH  Muscle tension headache  History of anemia - Plan: CBC  -Discussed various etiologies of dizziness or presyncope -I think she has a combination of things that could lead to her dizziness, including a mild upper respiratory illness, stress and her history of lower blood pressure and possibly a mild vagal presyncope -We will check some labs today -Also advised that she do try to get some sodium in her diet as she has been completely avoiding it  -Advised staying  hydrated -Advised prompt reevaluation if she has recurrent episodes or any other concerning symptoms -Patient advised to return or notify a doctor immediately if symptoms worsen or persist or new concerns arise.  Patient Instructions  BEFORE YOU LEAVE: -Does she want her flu shot? -Labs -she can pass out with lab work, make sure the lab is aware -follow up: In 3 months and as needed  Please drink plenty of water and make sure you include a small amount of salt in your food.  Seek prompt medical care if you have any other symptoms or your condition is worsening.  We have ordered labs or studies at this visit. It can take up to 1-2 weeks for results and processing. IF results require follow up or explanation, we will call you with instructions. Clinically stable results will be released to your [facility]. If you have not heard from [name] or cannot find your results in [facility] in 2 weeks please contact our office at [phone].  If you are not yet signed up for [facility], please consider signing up.           Kriste Basque R., DO

## 2017-08-07 NOTE — Patient Instructions (Signed)
BEFORE YOU LEAVE: -Does she want her flu shot? -Labs -she can pass out with lab work, make sure the lab is aware -follow up: In 3 months and as needed  Please drink plenty of water and make sure you include a small amount of salt in your food.  Seek prompt medical care if you have any other symptoms or your condition is worsening.  We have ordered labs or studies at this visit. It can take up to 1-2 weeks for results and processing. IF results require follow up or explanation, we will call you with instructions. Clinically stable results will be released to your [facility]. If you have not heard from us or cannot find your results in [facility] in 2 weeks please contact our office at [phone].  If you are not yet signed [facility] for [facility], please consider signing up.

## 2017-09-17 ENCOUNTER — Encounter: Payer: Self-pay | Admitting: Family Medicine

## 2017-11-05 ENCOUNTER — Ambulatory Visit: Payer: Self-pay | Admitting: Family Medicine

## 2017-12-03 ENCOUNTER — Encounter: Payer: Self-pay | Admitting: Family Medicine

## 2017-12-03 ENCOUNTER — Ambulatory Visit (INDEPENDENT_AMBULATORY_CARE_PROVIDER_SITE_OTHER): Payer: BLUE CROSS/BLUE SHIELD | Admitting: Family Medicine

## 2017-12-03 VITALS — BP 108/64 | HR 79 | Temp 98.4°F | Wt 123.0 lb

## 2017-12-03 DIAGNOSIS — M5417 Radiculopathy, lumbosacral region: Secondary | ICD-10-CM

## 2017-12-03 MED ORDER — CYCLOBENZAPRINE HCL 5 MG PO TABS: 5.0000 mg | ORAL_TABLET | Freq: Two times a day (BID) | ORAL | 0 refills | Status: AC | PRN

## 2017-12-03 NOTE — Patient Instructions (Signed)
Lumbosacral Radiculopathy Lumbosacral radiculopathy is a condition that involves the spinal nerves and nerve roots in the low back and bottom of the spine. The condition develops when these nerves and nerve roots move out of place or become inflamed and cause symptoms. What are the causes? This condition may be caused by:  Pressure from a disk that bulges out of place (herniated disk). A disk is a plate of cartilage that separates bones in the spine.  Disk degeneration.  A narrowing of the bones of the lower back (spinal stenosis).  A tumor.  An infection.  An injury that places sudden pressure on the disks that cushion the bones of your lower spine.  What increases the risk? This condition is more likely to develop in:  Males aged 30-50 years.  Females aged 50-60 years.  People who lift improperly.  People who are overweight or live a sedentary lifestyle.  People who smoke.  People who perform repetitive activities that strain the spine.  What are the signs or symptoms? Symptoms of this condition include:  Pain that goes down from the back into the legs (sciatica). This is the most common symptom. The pain may be worse with sitting, coughing, or sneezing.  Pain and numbness in the arms and legs.  Muscle weakness.  Tingling.  Loss of bladder control or bowel control.  How is this diagnosed? This condition is diagnosed with a physical exam and medical history. If the pain is lasting, you may have tests, such as:  MRI scan.  X-ray.  CT scan.  Myelogram.  Nerve conduction study.  How is this treated? This condition is often treated with:  Hot packs and ice applied to affected areas.  Stretches to improve flexibility.  Exercises to strengthen back muscles.  Physical therapy.  Pain medicine.  A steroid injection in the spine.  In some cases, no treatment is needed. If the condition is long-lasting (chronic), or if symptoms are severe, treatment may  involve surgery or lifestyle changes, such as following a weight loss plan. Follow these instructions at home: Medicines  Take medicines only as directed by your health care provider.  Do not drive or operate heavy machinery while taking pain medicine. Injury care  Apply a heat pack to the injured area as directed by your health care provider.  Apply ice to the affected area: ? Put ice in a plastic bag. ? Place a towel between your skin and the bag. ? Leave the ice on for 20-30 minutes, every 2 hours while you are awake or as needed. Or, leave the ice on for as long as directed by your health care provider. Other Instructions  If you were shown how to do any exercises or stretches, do them as directed by your health care provider.  If your health care provider prescribed a diet or exercise program, follow it as directed.  Keep all follow-up visits as directed by your health care provider. This is important. Contact a health care provider if:  Your pain does not improve over time even when taking pain medicines. Get help right away if:  Your develop severe pain.  Your pain suddenly gets worse.  You develop increasing weakness in your legs.  You lose the ability to control your bladder or bowel.  You have difficulty walking or balancing.  You have a fever. This information is not intended to replace advice given to you by your health care provider. Make sure you discuss any questions you have with your   health care provider. Document Released: 08/25/2005 Document Revised: 01/31/2016 Document Reviewed: 08/21/2014 Elsevier Interactive Patient Education  2018 [employer].  Back Exercises If you have pain in your back, do these exercises 2-3 times each day or as told by your doctor. When the pain goes away, do the exercises once each day, but repeat the steps more times for each exercise (do more repetitions). If you do not have pain in your back, do these exercises once each  day or as told by your doctor. Exercises Single Knee to Chest  Do these steps 3-5 times in a row for each leg: 1. Lie on your back on a firm bed or the floor with your legs stretched out. 2. Bring one knee to your chest. 3. Hold your knee to your chest by grabbing your knee or thigh. 4. Pull on your knee until you feel a gentle stretch in your lower back. 5. Keep doing the stretch for 10-30 seconds. 6. Slowly let go of your leg and straighten it.  Pelvic Tilt  Do these steps 5-10 times in a row: 1. Lie on your back on a firm bed or the floor with your legs stretched out. 2. Bend your knees so they point up to the ceiling. Your feet should be flat on the floor. 3. Tighten your lower belly (abdomen) muscles to press your lower back against the floor. This will make your tailbone point up to the ceiling instead of pointing down to your feet or the floor. 4. Stay in this position for 5-10 seconds while you gently tighten your muscles and breathe evenly.  Cat-Cow  Do these steps until your lower back bends more easily: 1. Get on your hands and knees on a firm surface. Keep your hands under your shoulders, and keep your knees under your hips. You may put padding under your knees. 2. Let your head hang down, and make your tailbone point down to the floor so your lower back is round like the back of a cat. 3. Stay in this position for 5 seconds. 4. Slowly lift your head and make your tailbone point up to the ceiling so your back hangs low (sags) like the back of a cow. 5. Stay in this position for 5 seconds.  Press-Ups  Do these steps 5-10 times in a row: 1. Lie on your belly (face-down) on the floor. 2. Place your hands near your head, about shoulder-width apart. 3. While you keep your back relaxed and keep your hips on the floor, slowly straighten your arms to raise the top half of your body and lift your shoulders. Do not use your back muscles. To make yourself more comfortable, you may  change where you place your hands. 4. Stay in this position for 5 seconds. 5. Slowly return to lying flat on the floor.  Bridges  Do these steps 10 times in a row: 1. Lie on your back on a firm surface. 2. Bend your knees so they point up to the ceiling. Your feet should be flat on the floor. 3. Tighten your butt muscles and lift your butt off of the floor until your waist is almost as high as your knees. If you do not feel the muscles working in your butt and the back of your thighs, slide your feet 1-2 inches farther away from your butt. 4. Stay in this position for 3-5 seconds. 5. Slowly lower your butt to the floor, and let your butt muscles relax.  If this exercise  is too easy, try doing it with your arms crossed over your chest. Belly Crunches  Do these steps 5-10 times in a row: 1. Lie on your back on a firm bed or the floor with your legs stretched out. 2. Bend your knees so they point up to the ceiling. Your feet should be flat on the floor. 3. Cross your arms over your chest. 4. Tip your chin a little bit toward your chest but do not bend your neck. 5. Tighten your belly muscles and slowly raise your chest just enough to lift your shoulder blades a tiny bit off of the floor. 6. Slowly lower your chest and your head to the floor.  Back Lifts Do these steps 5-10 times in a row: 1. Lie on your belly (face-down) with your arms at your sides, and rest your forehead on the floor. 2. Tighten the muscles in your legs and your butt. 3. Slowly lift your chest off of the floor while you keep your hips on the floor. Keep the back of your head in line with the curve in your back. Look at the floor while you do this. 4. Stay in this position for 3-5 seconds. 5. Slowly lower your chest and your face to the floor.  Contact a doctor if:  Your back pain gets a lot worse when you do an exercise.  Your back pain does not lessen 2 hours after you exercise. If you have any of these problems,  stop doing the exercises. Do not do them again unless your doctor says it is okay. Get help right away if:  You have sudden, very bad back pain. If this happens, stop doing the exercises. Do not do them again unless your doctor says it is okay. This information is not intended to replace advice given to you by your health care provider. Make sure you discuss any questions you have with your health care provider. Document Released: 09/27/2010 Document Revised: 01/31/2016 Document Reviewed: 10/19/2014 Elsevier Interactive Patient Education  [employer]2018 Else[employer] Inc.

## 2017-12-03 NOTE — Progress Notes (Signed)
Subjective:    Patient ID: Andrea Obrien, female    DOB: 10/11/1975, 42 y.o.   MRN: [id number]  Chief Complaint  Patient presents with  . Back Pain    HPI Patient was seen today for low back pain since Monday.  Pt does not recall injury.  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  Pt describes the pain as a stabbing sharp pain in her low back/buttock that occasionally goes down her right leg.  Pt states the pain today made it difficult for her to step on the gas with her right foot.  Pt has not tried anything for the symptoms.  Patient denies fever, chills, dysuria, loss of bowel or bladder.  Past Medical History:  Diagnosis Date  . Anemia    reports since she was a teenager, iron def per her report, reports had work up and told to take iron  . Hypothyroidism    diagnosed by Dr Vinnie LangtonGretchen Adkins-per patient    No Known Allergies  ROS General: Denies fever, chills, night sweats, changes in weight, changes in appetite HEENT: Denies headaches, ear pain, changes in vision, rhinorrhea, sore throat CV: Denies CP, palpitations, SOB, orthopnea Pulm: Denies SOB, cough, wheezing GI: Denies abdominal pain, nausea, vomiting, diarrhea, constipation GU: Denies dysuria, hematuria, frequency, vaginal discharge Msk: Denies muscle cramps, joint pains  +back pain Neuro: Denies weakness, numbness, tingling Skin: Denies rashes, bruising Psych: Denies depression, anxiety, hallucinations     Objective:    Blood pressure 108/64, pulse 79, temperature 98.4 F (36.9 C), temperature source Oral, weight 123 lb (55.8 kg), SpO2 99 %.   Gen. Pleasant, well-nourished, in no distress, normal affect   HEENT: /AT, face symmetric, no scleral icterus, PERRLA, nares patent without drainage Lungs: no accessory muscle use, CTAB Cardiovascular: RRR, no peripheral edema Musculoskeletal: No deformities, no cyanosis or clubbing, normal tone.  No TTP of sciatic nerve.  TTP of sacrum. Slightly decreased strength with dorsiflexion and plantar flexion of R foot. Neuro:  A&Ox3, CN II-XII intact, normal gait Skin:  Warm, no lesions/ rash   Wt Readings from Last 3 Encounters:  12/03/17 123 lb (55.8 kg)  08/07/17 127 lb 11.2 oz (57.9 kg)  02/05/17 119 lb 4.8 oz (54.1 kg)    Lab Results  Component Value Date   WBC 6.1 08/07/2017   HGB 11.5 (L) 08/07/2017   HCT 35.4 (L) 08/07/2017   PLT 355.0 08/07/2017   GLUCOSE 79 02/27/2015   CHOL 165 09/14/2014   TRIG 75.0 09/14/2014   HDL 66.90 09/14/2014   LDLCALC 83 09/14/2014   ALT 13 09/14/2014   AST 17 09/14/2014   NA 136 02/27/2015   K 3.7 02/27/2015   CL 105 02/27/2015   CREATININE 0.72 02/27/2015   BUN 14 02/27/2015   CO2 23 02/27/2015   TSH 2.17 08/07/2017    Assessment/Plan:  Lumbosacral radiculopathy at S1 -Discussed supportive care including NSAIDs, heat, etc -Given handout and exercises - Plan: cyclobenzaprine (FLEXERIL) 5 MG tablet -Given RTC precautions if symptoms become worse  Follow-up PRN  Abbe AmsterdamShannon Banks, MD

## 2017-12-04 ENCOUNTER — Encounter: Payer: Self-pay | Admitting: Family Medicine

## 2019-07-25 ENCOUNTER — Other Ambulatory Visit: Payer: Self-pay | Admitting: *Deleted

## 2019-07-25 DIAGNOSIS — Z20822 Contact with and (suspected) exposure to covid-19: Secondary | ICD-10-CM

## 2019-07-26 LAB — NOVEL CORONAVIRUS, NAA: SARS-CoV-2, NAA: NOT DETECTED

## 2019-07-27 ENCOUNTER — Other Ambulatory Visit: Payer: Self-pay

## 2019-07-27 DIAGNOSIS — Z20822 Contact with and (suspected) exposure to covid-19: Secondary | ICD-10-CM

## 2019-07-29 LAB — NOVEL CORONAVIRUS, NAA: SARS-CoV-2, NAA: NOT DETECTED

## 2019-11-11 ENCOUNTER — Ambulatory Visit: Payer: Self-pay | Attending: Internal Medicine

## 2019-11-11 DIAGNOSIS — Z23 Encounter for immunization: Secondary | ICD-10-CM | POA: Insufficient documentation

## 2019-11-11 NOTE — Progress Notes (Signed)
   Covid-19 Vaccination Clinic  Name:  Marva Hendryx    MRN: [id number] DOB: 1976/08/10  11/11/2019  Ms. Dura was observed post Covid-19 immunization for 15 minutes without incident. She was provided with Vaccine Information Sheet and instruction to access the V-Safe system.   Ms. Stratmann was instructed to call 911 with any severe reactions post vaccine: Marland Kitchen Difficulty breathing  . Swelling of face and throat  . A fast heartbeat  . A bad rash all over body  . Dizziness and weakness   Immunizations Administered    Name Date Dose VIS Date Route   Pfizer COVID-19 Vaccine 11/11/2019 10:42 AM 0.3 mL 08/19/2019 Intramuscular   Manufacturer: [employer], [employer]   Lot: [id number]   NDC: [address]-5

## 2019-12-07 ENCOUNTER — Ambulatory Visit: Payer: Self-pay | Attending: Internal Medicine

## 2019-12-07 DIAGNOSIS — Z23 Encounter for immunization: Secondary | ICD-10-CM

## 2019-12-07 NOTE — Progress Notes (Signed)
   Covid-19 Vaccination Clinic  Name:  Andrea Obrien    MRN: [id number] DOB: December 14, 1975  12/07/2019  [name] was observed post Covid-19 immunization for 15 minutes without incident. She was provided with Vaccine Information Sheet and instruction to access the V-Safe system.   [name] was instructed to call 911 with any severe reactions post vaccine: Marland Kitchen Difficulty breathing  . Swelling of face and throat  . A fast heartbeat  . A bad rash all over body  . Dizziness and weakness   Immunizations Administered    Name Date Dose VIS Date Route   Pfizer COVID-19 Vaccine 12/07/2019  9:38 AM 0.3 mL 08/19/2019 Intramuscular   Manufacturer: [employer], [employer]   Lot: [id number]   NDC: [address]-8

## 2023-06-20 LAB — COLOGUARD: COLOGUARD: POSITIVE — AB
